# Patient Record
Sex: Female | Born: 1975 | Race: White | Hispanic: No | Smoking: Never smoker
Health system: Southern US, Community
[De-identification: ages and names within clinical notes are randomized; demographics above are authoritative.]

## PROBLEM LIST (undated history)

## (undated) DIAGNOSIS — I2542 Coronary artery dissection: Secondary | ICD-10-CM

## (undated) HISTORY — DX: Coronary artery dissection: I25.42

---

## 2016-11-19 DIAGNOSIS — Z3041 Encounter for surveillance of contraceptive pills: Secondary | ICD-10-CM | POA: Diagnosis not present

## 2016-11-19 DIAGNOSIS — Z01419 Encounter for gynecological examination (general) (routine) without abnormal findings: Secondary | ICD-10-CM | POA: Diagnosis not present

## 2016-11-19 DIAGNOSIS — Z113 Encounter for screening for infections with a predominantly sexual mode of transmission: Secondary | ICD-10-CM | POA: Diagnosis not present

## 2016-11-19 DIAGNOSIS — Z124 Encounter for screening for malignant neoplasm of cervix: Secondary | ICD-10-CM | POA: Diagnosis not present

## 2016-11-19 DIAGNOSIS — Z1389 Encounter for screening for other disorder: Secondary | ICD-10-CM | POA: Diagnosis not present

## 2017-10-06 DIAGNOSIS — L638 Other alopecia areata: Secondary | ICD-10-CM | POA: Diagnosis not present

## 2017-11-25 DIAGNOSIS — Z309 Encounter for contraceptive management, unspecified: Secondary | ICD-10-CM | POA: Diagnosis not present

## 2017-11-25 DIAGNOSIS — Z124 Encounter for screening for malignant neoplasm of cervix: Secondary | ICD-10-CM | POA: Diagnosis not present

## 2017-11-25 DIAGNOSIS — Z01419 Encounter for gynecological examination (general) (routine) without abnormal findings: Secondary | ICD-10-CM | POA: Diagnosis not present

## 2017-11-25 DIAGNOSIS — Z1151 Encounter for screening for human papillomavirus (HPV): Secondary | ICD-10-CM | POA: Diagnosis not present

## 2017-11-25 DIAGNOSIS — Z1389 Encounter for screening for other disorder: Secondary | ICD-10-CM | POA: Diagnosis not present

## 2017-11-26 DIAGNOSIS — Z124 Encounter for screening for malignant neoplasm of cervix: Secondary | ICD-10-CM | POA: Diagnosis not present

## 2018-01-06 DIAGNOSIS — L638 Other alopecia areata: Secondary | ICD-10-CM | POA: Diagnosis not present

## 2018-01-06 DIAGNOSIS — D2371 Other benign neoplasm of skin of right lower limb, including hip: Secondary | ICD-10-CM | POA: Diagnosis not present

## 2018-05-08 DIAGNOSIS — H1089 Other conjunctivitis: Secondary | ICD-10-CM | POA: Diagnosis not present

## 2018-12-27 DIAGNOSIS — L638 Other alopecia areata: Secondary | ICD-10-CM | POA: Diagnosis not present

## 2019-01-03 DIAGNOSIS — Z3202 Encounter for pregnancy test, result negative: Secondary | ICD-10-CM | POA: Diagnosis not present

## 2019-01-03 DIAGNOSIS — Z130 Encounter for screening for diseases of the blood and blood-forming organs and certain disorders involving the immune mechanism: Secondary | ICD-10-CM | POA: Diagnosis not present

## 2019-01-03 DIAGNOSIS — Z01419 Encounter for gynecological examination (general) (routine) without abnormal findings: Secondary | ICD-10-CM | POA: Diagnosis not present

## 2019-01-03 DIAGNOSIS — N926 Irregular menstruation, unspecified: Secondary | ICD-10-CM | POA: Diagnosis not present

## 2019-01-03 DIAGNOSIS — Z1389 Encounter for screening for other disorder: Secondary | ICD-10-CM | POA: Diagnosis not present

## 2019-01-03 DIAGNOSIS — N803 Endometriosis of pelvic peritoneum: Secondary | ICD-10-CM | POA: Diagnosis not present

## 2019-01-03 DIAGNOSIS — Z124 Encounter for screening for malignant neoplasm of cervix: Secondary | ICD-10-CM | POA: Diagnosis not present

## 2019-01-27 DIAGNOSIS — L638 Other alopecia areata: Secondary | ICD-10-CM | POA: Diagnosis not present

## 2020-02-20 DIAGNOSIS — Z1389 Encounter for screening for other disorder: Secondary | ICD-10-CM | POA: Diagnosis not present

## 2020-02-20 DIAGNOSIS — N951 Menopausal and female climacteric states: Secondary | ICD-10-CM | POA: Diagnosis not present

## 2020-02-20 DIAGNOSIS — Z01419 Encounter for gynecological examination (general) (routine) without abnormal findings: Secondary | ICD-10-CM | POA: Diagnosis not present

## 2020-02-20 DIAGNOSIS — E669 Obesity, unspecified: Secondary | ICD-10-CM | POA: Diagnosis not present

## 2020-02-20 DIAGNOSIS — Z124 Encounter for screening for malignant neoplasm of cervix: Secondary | ICD-10-CM | POA: Diagnosis not present

## 2020-02-20 DIAGNOSIS — Z130 Encounter for screening for diseases of the blood and blood-forming organs and certain disorders involving the immune mechanism: Secondary | ICD-10-CM | POA: Diagnosis not present

## 2020-02-20 DIAGNOSIS — N803 Endometriosis of pelvic peritoneum: Secondary | ICD-10-CM | POA: Diagnosis not present

## 2020-04-05 DIAGNOSIS — M79671 Pain in right foot: Secondary | ICD-10-CM | POA: Diagnosis not present

## 2020-04-05 DIAGNOSIS — M2012 Hallux valgus (acquired), left foot: Secondary | ICD-10-CM | POA: Diagnosis not present

## 2020-04-05 DIAGNOSIS — M79672 Pain in left foot: Secondary | ICD-10-CM | POA: Diagnosis not present

## 2020-04-05 DIAGNOSIS — M2011 Hallux valgus (acquired), right foot: Secondary | ICD-10-CM | POA: Diagnosis not present

## 2020-04-10 DIAGNOSIS — M2012 Hallux valgus (acquired), left foot: Secondary | ICD-10-CM | POA: Diagnosis not present

## 2020-04-11 ENCOUNTER — Other Ambulatory Visit: Payer: Self-pay | Admitting: Podiatry

## 2020-04-17 NOTE — Anesthesia Preprocedure Evaluation (Addendum)
Anesthesia Evaluation  Patient identified by MRN, date of birth, ID band Patient awake    Reviewed: Allergy & Precautions, NPO status , Patient's Chart, lab work & pertinent test results, reviewed documented beta blocker date and time   History of Anesthesia Complications Negative for: history of anesthetic complications  Airway Mallampati: II  TM Distance: >3 FB Neck ROM: Full    Dental   Pulmonary    breath sounds clear to auscultation       Cardiovascular (-) angina(-) DOE  Rhythm:Regular Rate:Normal     Neuro/Psych    GI/Hepatic neg GERD  ,  Endo/Other   Endometriosis  Renal/GU      Musculoskeletal   Abdominal   Peds  Hematology   Anesthesia Other Findings   Reproductive/Obstetrics                            Anesthesia Physical Anesthesia Plan  ASA: I  Anesthesia Plan: General   Post-op Pain Management:    Induction: Intravenous  PONV Risk Score and Plan: 3 and Treatment may vary due to age or medical condition, Ondansetron, Dexamethasone and Midazolam  Airway Management Planned: LMA  Additional Equipment:   Intra-op Plan:   Post-operative Plan: Extubation in OR  Informed Consent: I have reviewed the patients History and Physical, chart, labs and discussed the procedure including the risks, benefits and alternatives for the proposed anesthesia with the patient or authorized representative who has indicated his/her understanding and acceptance.       Plan Discussed with: CRNA and Anesthesiologist  Anesthesia Plan Comments:         Anesthesia Quick Evaluation

## 2020-04-18 ENCOUNTER — Other Ambulatory Visit: Payer: Self-pay

## 2020-04-18 ENCOUNTER — Encounter: Payer: Self-pay | Admitting: Podiatry

## 2020-04-23 ENCOUNTER — Other Ambulatory Visit: Payer: Self-pay

## 2020-04-23 ENCOUNTER — Other Ambulatory Visit
Admission: RE | Admit: 2020-04-23 | Discharge: 2020-04-23 | Disposition: A | Discharge status: Home / Self Care | Payer: 59 | Source: Ambulatory Visit | Attending: Podiatry | Admitting: Podiatry

## 2020-04-23 DIAGNOSIS — Z20822 Contact with and (suspected) exposure to covid-19: Secondary | ICD-10-CM | POA: Diagnosis not present

## 2020-04-23 DIAGNOSIS — Z01812 Encounter for preprocedural laboratory examination: Secondary | ICD-10-CM | POA: Insufficient documentation

## 2020-04-23 LAB — SARS CORONAVIRUS 2 (TAT 6-24 HRS): SARS Coronavirus 2: NEGATIVE

## 2020-04-25 ENCOUNTER — Other Ambulatory Visit: Payer: Self-pay

## 2020-04-25 ENCOUNTER — Encounter: Payer: Self-pay | Admitting: Podiatry

## 2020-04-25 ENCOUNTER — Encounter
Admission: RE | Disposition: A | Discharge status: Home / Self Care | Payer: Self-pay | Source: Home / Self Care | Attending: Podiatry

## 2020-04-25 ENCOUNTER — Ambulatory Visit: Payer: 59 | Admitting: Anesthesiology

## 2020-04-25 ENCOUNTER — Ambulatory Visit
Admission: RE | Admit: 2020-04-25 | Discharge: 2020-04-25 | Disposition: A | Discharge status: Home / Self Care | Payer: 59 | Attending: Podiatry | Admitting: Podiatry

## 2020-04-25 DIAGNOSIS — M2012 Hallux valgus (acquired), left foot: Secondary | ICD-10-CM | POA: Insufficient documentation

## 2020-04-25 HISTORY — PX: HALLUX VALGUS AKIN: SHX6622

## 2020-04-25 HISTORY — PX: BUNIONECTOMY: SHX129

## 2020-04-25 SURGERY — BUNIONECTOMY
Anesthesia: General | Site: Toe | Laterality: Left

## 2020-04-25 MED ORDER — ONDANSETRON HCL 4 MG/2ML IJ SOLN
INTRAMUSCULAR | Status: DC | PRN
  Administered 2020-04-25: 4 mg via INTRAVENOUS

## 2020-04-25 MED ORDER — LACTATED RINGERS IV SOLN
100.0000 mL/h | INTRAVENOUS | Status: DC
  Administered 2020-04-25: 100 mL/h via INTRAVENOUS

## 2020-04-25 MED ORDER — LIDOCAINE HCL (CARDIAC) PF 100 MG/5ML IV SOSY
PREFILLED_SYRINGE | INTRAVENOUS | Status: DC | PRN
  Administered 2020-04-25: 40 mg via INTRATRACHEAL

## 2020-04-25 MED ORDER — PROPOFOL 10 MG/ML IV BOLUS
INTRAVENOUS | Status: DC | PRN
  Administered 2020-04-25: 140 mg via INTRAVENOUS

## 2020-04-25 MED ORDER — GLYCOPYRROLATE 0.2 MG/ML IJ SOLN
INTRAMUSCULAR | Status: DC | PRN
  Administered 2020-04-25: .1 mg via INTRAVENOUS

## 2020-04-25 MED ORDER — BUPIVACAINE LIPOSOME 1.3 % IJ SUSP
INTRAMUSCULAR | Status: DC | PRN
  Administered 2020-04-25: 20 mL

## 2020-04-25 MED ORDER — HYDROMORPHONE HCL 1 MG/ML IJ SOLN: 0.2500 mg | INTRAMUSCULAR | Status: DC | PRN

## 2020-04-25 MED ORDER — PROMETHAZINE HCL 25 MG/ML IJ SOLN
6.2500 mg | INTRAMUSCULAR | Status: DC | PRN
  Administered 2020-04-25: 6.25 mg via INTRAVENOUS

## 2020-04-25 MED ORDER — ONDANSETRON HCL 4 MG PO TABS: 4.0000 mg | ORAL_TABLET | Freq: Every day | ORAL | 1 refills | Status: AC | PRN

## 2020-04-25 MED ORDER — 0.9 % SODIUM CHLORIDE (POUR BTL) OPTIME
TOPICAL | Status: DC | PRN
  Administered 2020-04-25: 500 mL

## 2020-04-25 MED ORDER — MIDAZOLAM HCL 5 MG/5ML IJ SOLN
INTRAMUSCULAR | Status: DC | PRN
  Administered 2020-04-25: 2 mg via INTRAVENOUS

## 2020-04-25 MED ORDER — BUPIVACAINE HCL (PF) 0.25 % IJ SOLN
INTRAMUSCULAR | Status: DC | PRN
  Administered 2020-04-25: 10 mL

## 2020-04-25 MED ORDER — CEFAZOLIN SODIUM-DEXTROSE 2-4 GM/100ML-% IV SOLN
2.0000 g | INTRAVENOUS | Status: AC
  Administered 2020-04-25: 2 g via INTRAVENOUS

## 2020-04-25 MED ORDER — MEPERIDINE HCL 25 MG/ML IJ SOLN: 6.2500 mg | INTRAMUSCULAR | Status: DC | PRN

## 2020-04-25 MED ORDER — DEXAMETHASONE SODIUM PHOSPHATE 4 MG/ML IJ SOLN
INTRAMUSCULAR | Status: DC | PRN
  Administered 2020-04-25: 4 mg via INTRAVENOUS

## 2020-04-25 MED ORDER — OXYCODONE HCL 5 MG PO TABS: 5.0000 mg | ORAL_TABLET | Freq: Once | ORAL | Status: DC | PRN

## 2020-04-25 MED ORDER — FENTANYL CITRATE (PF) 100 MCG/2ML IJ SOLN
INTRAMUSCULAR | Status: DC | PRN
  Administered 2020-04-25: 25 ug via INTRAVENOUS
  Administered 2020-04-25: 12.5 ug via INTRAVENOUS
  Administered 2020-04-25: 50 ug via INTRAVENOUS
  Administered 2020-04-25: 12.5 ug via INTRAVENOUS

## 2020-04-25 MED ORDER — OXYCODONE-ACETAMINOPHEN 5-325 MG PO TABS: 1.0000 | ORAL_TABLET | Freq: Four times a day (QID) | ORAL | 0 refills | Status: AC | PRN

## 2020-04-25 MED ORDER — ONDANSETRON HCL 4 MG/2ML IJ SOLN: 4.0000 mg | Freq: Four times a day (QID) | INTRAMUSCULAR | Status: DC | PRN

## 2020-04-25 MED ORDER — POVIDONE-IODINE 7.5 % EX SOLN: Freq: Once | CUTANEOUS | Status: DC

## 2020-04-25 MED ORDER — ONDANSETRON HCL 4 MG PO TABS: 4.0000 mg | ORAL_TABLET | Freq: Four times a day (QID) | ORAL | Status: DC | PRN

## 2020-04-25 MED ORDER — OXYCODONE HCL 5 MG/5ML PO SOLN: 5.0000 mg | Freq: Once | ORAL | Status: DC | PRN

## 2020-04-25 SURGICAL SUPPLY — 46 items
BENZOIN TINCTURE PRP APPL 2/3 (GAUZE/BANDAGES/DRESSINGS) ×2 IMPLANT
BLADE MED AGGRESSIVE (BLADE) IMPLANT
BLADE OSC/SAGITTAL MD 5.5X18 (BLADE) IMPLANT
BLADE SAW LAPIPLASTY 40X11 (INSTRUMENTS) ×2 IMPLANT
BLADE SURG 15 STRL LF DISP TIS (BLADE) ×4 IMPLANT
BLADE SURG 15 STRL SS (BLADE) ×4
BNDG COHESIVE 4X5 TAN STRL (GAUZE/BANDAGES/DRESSINGS) ×2 IMPLANT
BNDG ELASTIC 4X5.8 VLCR STR LF (GAUZE/BANDAGES/DRESSINGS) ×2 IMPLANT
BNDG ESMARK 4X12 TAN STRL LF (GAUZE/BANDAGES/DRESSINGS) ×2 IMPLANT
BNDG GAUZE 4.5X4.1 6PLY STRL (MISCELLANEOUS) ×2 IMPLANT
BNDG STRETCH 4X75 STRL LF (GAUZE/BANDAGES/DRESSINGS) ×2 IMPLANT
CANISTER SUCT 1200ML W/VALVE (MISCELLANEOUS) ×2 IMPLANT
CONTROL 360 (Bone Implant) ×2 IMPLANT
COVER LIGHT HANDLE UNIVERSAL (MISCELLANEOUS) ×4 IMPLANT
CUFF TOURN SGL QUICK 18X4 (TOURNIQUET CUFF) IMPLANT
DRAPE FLUOR MINI C-ARM 54X84 (DRAPES) ×2 IMPLANT
DURAPREP 26ML APPLICATOR (WOUND CARE) ×2 IMPLANT
ELECT REM PT RETURN 9FT ADLT (ELECTROSURGICAL) ×2
ELECTRODE REM PT RTRN 9FT ADLT (ELECTROSURGICAL) ×1 IMPLANT
GAUZE SPONGE 4X4 12PLY STRL (GAUZE/BANDAGES/DRESSINGS) ×2 IMPLANT
GAUZE XEROFORM 1X8 LF (GAUZE/BANDAGES/DRESSINGS) ×2 IMPLANT
GLOVE BIO SURGEON STRL SZ7.5 (GLOVE) ×2 IMPLANT
GLOVE INDICATOR 8.0 STRL GRN (GLOVE) ×2 IMPLANT
GOWN STRL REUS W/ TWL LRG LVL3 (GOWN DISPOSABLE) ×2 IMPLANT
GOWN STRL REUS W/TWL LRG LVL3 (GOWN DISPOSABLE) ×2
KIT TURNOVER KIT A (KITS) ×2 IMPLANT
NS IRRIG 500ML POUR BTL (IV SOLUTION) ×2 IMPLANT
PACK EXTREMITY ARMC (MISCELLANEOUS) ×2 IMPLANT
PENCIL SMOKE EVACUATOR (MISCELLANEOUS) ×2 IMPLANT
RASP SM TEAR CROSS CUT (RASP) ×2 IMPLANT
STOCKINETTE IMPERVIOUS LG (DRAPES) ×2 IMPLANT
STRIP CLOSURE SKIN 1/4X4 (GAUZE/BANDAGES/DRESSINGS) ×2 IMPLANT
SUT ETHILON 4-0 (SUTURE) ×1
SUT ETHILON 4-0 FS2 18XMFL BLK (SUTURE) ×1
SUT MNCRL 4-0 (SUTURE) ×1
SUT MNCRL 4-0 27XMFL (SUTURE) ×1
SUT MNCRL+ 5-0 UNDYED PC-3 (SUTURE) IMPLANT
SUT MONOCRYL 5-0 (SUTURE)
SUT VIC AB 2-0 SH 27 (SUTURE)
SUT VIC AB 2-0 SH 27XBRD (SUTURE) IMPLANT
SUT VIC AB 3-0 SH 27 (SUTURE) ×1
SUT VIC AB 3-0 SH 27X BRD (SUTURE) ×1 IMPLANT
SUT VIC AB 4-0 SH 27 (SUTURE) ×1
SUT VIC AB 4-0 SH 27XANBCTRL (SUTURE) ×1 IMPLANT
SUTURE ETHLN 4-0 FS2 18XMF BLK (SUTURE) ×1 IMPLANT
SUTURE MNCRL 4-0 27XMF (SUTURE) ×1 IMPLANT

## 2020-04-25 NOTE — Transfer of Care (Signed)
Immediate Anesthesia Transfer of Care Note  Patient: Traci May  Procedure(s) Performed: LAPIDUS TYPE LEFT (Left Toe) HALLUX VALGUS AKIN (Left Toe)  Patient Location: PACU  Anesthesia Type: General  Level of Consciousness: awake, alert  and patient cooperative  Airway and Oxygen Therapy: Patient Spontanous Breathing and Patient connected to supplemental oxygen  Post-op Assessment: Post-op Vital signs reviewed, Patient's Cardiovascular Status Stable, Respiratory Function Stable, Patent Airway and No signs of Nausea or vomiting  Post-op Vital Signs: Reviewed and stable  Complications: No apparent anesthesia complications

## 2020-04-25 NOTE — Anesthesia Postprocedure Evaluation (Signed)
Anesthesia Post Note  Patient: Traci May  Procedure(s) Performed: LAPIDUS TYPE LEFT (Left Toe) HALLUX VALGUS AKIN (Left Toe)     Patient location during evaluation: PACU Anesthesia Type: General Level of consciousness: awake and alert Pain management: pain level controlled Vital Signs Assessment: post-procedure vital signs reviewed and stable Respiratory status: spontaneous breathing, nonlabored ventilation, respiratory function stable and patient connected to nasal cannula oxygen Cardiovascular status: blood pressure returned to baseline and stable Postop Assessment: no apparent nausea or vomiting Anesthetic complications: no    Laquinton Bihm A  Antoniette Peake

## 2020-04-25 NOTE — H&P (Signed)
HISTORY AND PHYSICAL INTERVAL NOTE:  04/25/2020  12:06 PM  Traci May  has presented today for surgery, with the diagnosis of M20.12  HALLUX VALGUS OF LEFT FOOT.  The various methods of treatment have been discussed with the patient.  No guarantees were given.  After consideration of risks, benefits and other options for treatment, the patient has consented to surgery.  I have reviewed the patients' chart and labs.     A history and physical examination was performed in my office.  The patient was reexamined.  There have been no changes to this history and physical examination.  Traci May A

## 2020-04-25 NOTE — Discharge Instructions (Signed)
[facility]  POST OPERATIVE INSTRUCTIONS FOR DR. TROXLER, DR. FOWLER, AND DR. [facility] PODIATRY [facility]PART[facility]   1. Take your medication as prescribed.  Pain medication should be taken only as needed.  2. Keep the dressing clean, dry and intact.  3. Keep your foot elevated above the heart level for the first 48 hours.  4. Walking to the bathroom and brief periods of walking are acceptable, unless we have instructed you to be non-weight bearing.  5. Always wear your post-op shoe when walking.  Always use your crutches if you are to be non-weight bearing.  6. Do not take a shower. Baths are permissible as long as the foot is kept out of the water.   7. Every hour you are awake:  - Bend your knee 15 times. - Flex foot 15 times - Massage calf 15 times  8. Call [facility] ([phone]) if any of the following problems occur: - You develop a temperature or fever. - The bandage becomes saturated with blood. - Medication does not stop your pain. - Injury of the foot occurs. - Any symptoms of infection including redness, odor, or red streaks running from wound. -  General Anesthesia, Adult, Care After This sheet gives you information about how to care for yourself after your procedure. Your health care provider may also give you more specific instructions. If you have problems or questions, contact your health care provider. What can I expect after the procedure? After the procedure, the following side effects are common:  Pain or discomfort at the IV site.  Nausea.  Vomiting.  Sore throat.  Trouble concentrating.  Feeling cold or chills.  Weak or tired.  Sleepiness and fatigue.  Soreness and body aches. These side effects can affect parts of the body that were not involved in surgery. Follow these instructions at home:  For at least 24 hours after the procedure:  Have a responsible adult stay with you. It is  important to have someone help care for you until you are awake and alert.  Rest as needed.  Do not: ? Participate in activities in which you could fall or become injured. ? Drive. ? Use heavy machinery. ? Drink alcohol. ? Take sleeping pills or medicines that cause drowsiness. ? Make important decisions or sign legal documents. ? Take care of children on your own. Eating and drinking  Follow any instructions from your health care provider about eating or drinking restrictions.  When you feel hungry, start by eating small amounts of foods that are soft and easy to digest (bland), such as toast. Gradually return to your regular diet.  Drink enough fluid to keep your urine pale yellow.  If you vomit, rehydrate by drinking water, juice, or clear broth. General instructions  If you have sleep apnea, surgery and certain medicines can increase your risk for breathing problems. Follow instructions from your health care provider about wearing your sleep device: ? Anytime you are sleeping, including during daytime naps. ? While taking prescription pain medicines, sleeping medicines, or medicines that make you drowsy.  Return to your normal activities as told by your health care provider. Ask your health care provider what activities are safe for you.  Take over-the-counter and prescription medicines only as told by your health care provider.  If you smoke, do not smoke without supervision.  Keep all follow-up visits as told by your health care provider. This is important. Contact a health care provider if:    You have nausea or vomiting that does not get better with medicine.  You cannot eat or drink without vomiting.  You have pain that does not get better with medicine.  You are unable to pass urine.  You develop a skin rash.  You have a fever.  You have redness around your IV site that gets worse. Get help right away if:  You have difficulty breathing.  You have chest  pain.  You have blood in your urine or stool, or you vomit blood. Summary  After the procedure, it is common to have a sore throat or nausea. It is also common to feel tired.  Have a responsible adult stay with you for the first 24 hours after general anesthesia. It is important to have someone help care for you until you are awake and alert.  When you feel hungry, start by eating small amounts of foods that are soft and easy to digest (bland), such as toast. Gradually return to your regular diet.  Drink enough fluid to keep your urine pale yellow.  Return to your normal activities as told by your health care provider. Ask your health care provider what activities are safe for you. This information is not intended to replace advice given to you by your health care provider. Make sure you discuss any questions you have with your health care provider. Document Revised: 12/18/2017 Document Reviewed: 07/31/2017 Elsevier Patient Education  2020 [employer]  Information for Discharge Teaching: EXPAREL (bupivacaine liposome injectable suspension)   Your surgeon or anesthesiologist gave you EXPAREL(bupivacaine) to help control your pain after surgery.   EXPAREL is a local anesthetic that provides pain relief by numbing the tissue around the surgical site.  EXPAREL is designed to release pain medication over time and can control pain for up to 72 hours.  Depending on how you respond to EXPAREL, you may require less pain medication during your recovery.  Possible side effects:  Temporary loss of sensation or ability to move in the area where bupivacaine was injected.  Nausea, vomiting, constipation  Rarely, numbness and tingling in your mouth or lips, lightheadedness, or anxiety may occur.  Call your doctor right away if you think you may be experiencing any of these sensations, or if you have other questions regarding possible side effects.  Follow all other discharge instructions  given to you by your surgeon or nurse. Eat a healthy diet and drink plenty of water or other fluids.  If you return to the hospital for any reason within 96 hours following the administration of EXPAREL, it is important for health care providers to know that you have received this anesthetic. A teal colored band has been placed on your arm with the date, time and amount of EXPAREL you have received in order to alert and inform your health care providers. Please leave this armband in place for the full 96 hours following administration, and then you may remove the band.  

## 2020-04-25 NOTE — Op Note (Signed)
Operative note   Surgeon:Alejandro Gamel Lawyer: None    Preop diagnosis: Left foot hallux valgus    Postop diagnosis: Same    Procedure: Lapidus hallux valgus correction left foot with lapiplasty set.    EBL: Minimal    Anesthesia:local and general.  Local consisted of a total of 10 cc of 0.25% bupivacaine and 20 cc of Exparel long-acting anesthetic    Hemostasis: Mid calf tourniquet inflated to 200 mmHg for 120 minutes    Specimen: None    Complications: None    Operative indications:Traci May is an 44 y.o. that presents today for surgical intervention.  The risks/benefits/alternatives/complications have been discussed and consent has been given.    Procedure:  Patient was brought into the OR and placed on the operating table in thesupine position. After anesthesia was obtained theleft lower extremity was prepped and draped in usual sterile fashion.  Attention was directed to the dorsal left first metatarsocuneiform joint where a dorsal incision was made just medial to the long extensor tendon.  Sharp and blunt dissection carried down to the periosteum.  Subperiosteal dissection was then undertaken.  This time the joint was freed.  This was then released and derotated into a better stabilized position.  I was then able to apply the positioning device to hold the intermetatarsal angle into a corrected position.  This was then stabilized and the joint seeker was placed into the joint.  The cuts were made to the base of the first metatarsal and medial cuneiform.  The joint was then evaluated.  The joint was then prepared with a 2.0 mm drill bit through the subchondral bone plate.  Next the compression device was applied.  The toe was derotated into a good position and good realignment of the intermetatarsal joint was noted.  Next the dorsal and medial plates were positioned after placement of compression K wires.  Good stability was noted.  Good realignment of the MTPJ was then  noted at this time.  A dorsomedial incision was then performed.  The dorsomedial eminence was noted at this time and transected with a power saw and smoothed with a power rasp.  The wound was flushed with copious amounts of irrigation.  Good realignment of the MTPJ was noted.  A small capsulorrhaphy was performed medially.  Closure was then performed with a 3-0 Vicryl for the deeper and subcutaneous tissues with a 4-0 Vicryl in subcutaneous tissue and a 4-0 Monocryl for skin.  Bulky sterile dressing was then applied.    Patient tolerated the procedure and anesthesia well.  Was transported from the OR to the PACU with all vital signs stable and vascular status intact. To be discharged per routine protocol.  Will follow up in approximately 1 week in the outpatient clinic.

## 2020-04-25 NOTE — Anesthesia Procedure Notes (Signed)
Procedure Name: LMA Insertion Date/Time: 04/25/2020 12:20 PM Performed by: Mayme Genta, CRNA Pre-anesthesia Checklist: Patient identified, Emergency Drugs available, Suction available, Timeout performed and Patient being monitored Patient Re-evaluated:Patient Re-evaluated prior to induction Oxygen Delivery Method: Circle system utilized Preoxygenation: Pre-oxygenation with 100% oxygen Induction Type: IV induction LMA: LMA inserted LMA Size: 3.0 Number of attempts: 1 Placement Confirmation: positive ETCO2 and breath sounds checked- equal and bilateral Tube secured with: Tape

## 2020-04-26 ENCOUNTER — Encounter: Payer: Self-pay | Admitting: *Deleted

## 2020-05-03 DIAGNOSIS — M2012 Hallux valgus (acquired), left foot: Secondary | ICD-10-CM | POA: Diagnosis not present

## 2020-06-07 DIAGNOSIS — M2012 Hallux valgus (acquired), left foot: Secondary | ICD-10-CM | POA: Diagnosis not present

## 2020-06-07 DIAGNOSIS — M79672 Pain in left foot: Secondary | ICD-10-CM | POA: Diagnosis not present

## 2020-07-05 DIAGNOSIS — M2012 Hallux valgus (acquired), left foot: Secondary | ICD-10-CM | POA: Diagnosis not present

## 2020-08-16 DIAGNOSIS — M2012 Hallux valgus (acquired), left foot: Secondary | ICD-10-CM | POA: Diagnosis not present

## 2020-08-16 DIAGNOSIS — M722 Plantar fascial fibromatosis: Secondary | ICD-10-CM | POA: Diagnosis not present

## 2020-08-16 DIAGNOSIS — M79672 Pain in left foot: Secondary | ICD-10-CM | POA: Diagnosis not present

## 2021-01-24 ENCOUNTER — Other Ambulatory Visit: Payer: Self-pay | Admitting: Obstetrics and Gynecology

## 2021-01-24 DIAGNOSIS — Z1231 Encounter for screening mammogram for malignant neoplasm of breast: Secondary | ICD-10-CM

## 2021-02-04 ENCOUNTER — Other Ambulatory Visit: Payer: Self-pay

## 2021-02-04 ENCOUNTER — Ambulatory Visit
Admission: RE | Admit: 2021-02-04 | Discharge: 2021-02-04 | Disposition: A | Discharge status: Home / Self Care | Payer: 59 | Source: Ambulatory Visit | Attending: Obstetrics and Gynecology | Admitting: Obstetrics and Gynecology

## 2021-02-04 DIAGNOSIS — Z1231 Encounter for screening mammogram for malignant neoplasm of breast: Secondary | ICD-10-CM | POA: Insufficient documentation

## 2021-02-18 DIAGNOSIS — N803 Endometriosis of pelvic peritoneum: Secondary | ICD-10-CM | POA: Diagnosis not present

## 2021-02-18 DIAGNOSIS — Z6829 Body mass index (BMI) 29.0-29.9, adult: Secondary | ICD-10-CM | POA: Diagnosis not present

## 2021-02-18 DIAGNOSIS — Z130 Encounter for screening for diseases of the blood and blood-forming organs and certain disorders involving the immune mechanism: Secondary | ICD-10-CM | POA: Diagnosis not present

## 2021-02-18 DIAGNOSIS — Z1389 Encounter for screening for other disorder: Secondary | ICD-10-CM | POA: Diagnosis not present

## 2021-02-18 DIAGNOSIS — Z124 Encounter for screening for malignant neoplasm of cervix: Secondary | ICD-10-CM | POA: Diagnosis not present

## 2021-02-18 DIAGNOSIS — E669 Obesity, unspecified: Secondary | ICD-10-CM | POA: Diagnosis not present

## 2021-02-18 DIAGNOSIS — Z01419 Encounter for gynecological examination (general) (routine) without abnormal findings: Secondary | ICD-10-CM | POA: Diagnosis not present

## 2021-02-19 DIAGNOSIS — R87610 Atypical squamous cells of undetermined significance on cytologic smear of cervix (ASC-US): Secondary | ICD-10-CM | POA: Diagnosis not present

## 2021-02-19 DIAGNOSIS — R87619 Unspecified abnormal cytological findings in specimens from cervix uteri: Secondary | ICD-10-CM | POA: Diagnosis not present

## 2021-02-19 DIAGNOSIS — Z124 Encounter for screening for malignant neoplasm of cervix: Secondary | ICD-10-CM | POA: Diagnosis not present

## 2021-05-30 ENCOUNTER — Other Ambulatory Visit: Payer: Self-pay

## 2021-05-30 MED ORDER — CYCLOBENZAPRINE HCL 10 MG PO TABS
10.0000 mg | ORAL_TABLET | Freq: Two times a day (BID) | ORAL | 0 refills | Status: DC | PRN
  Filled 2021-05-30: qty 45, 23d supply, fill #0

## 2022-02-12 ENCOUNTER — Other Ambulatory Visit: Payer: Self-pay

## 2022-02-12 ENCOUNTER — Telehealth: Payer: 59 | Admitting: Family

## 2022-02-12 DIAGNOSIS — J209 Acute bronchitis, unspecified: Secondary | ICD-10-CM | POA: Diagnosis not present

## 2022-02-12 MED ORDER — PREDNISONE 10 MG PO TABS
ORAL_TABLET | ORAL | 0 refills | Status: DC
  Filled 2022-02-12: qty 21, 6d supply, fill #0

## 2022-02-12 MED ORDER — BENZONATATE 100 MG PO CAPS
100.0000 mg | ORAL_CAPSULE | Freq: Three times a day (TID) | ORAL | 0 refills | Status: DC | PRN
  Filled 2022-02-12: qty 20, 7d supply, fill #0

## 2022-02-12 NOTE — Progress Notes (Signed)

## 2022-02-17 ENCOUNTER — Other Ambulatory Visit: Payer: Self-pay

## 2022-02-17 ENCOUNTER — Ambulatory Visit
Admission: RE | Admit: 2022-02-17 | Discharge: 2022-02-17 | Disposition: A | Discharge status: Home / Self Care | Payer: 59 | Source: Ambulatory Visit | Attending: Emergency Medicine | Admitting: Emergency Medicine

## 2022-02-17 VITALS — BP 143/85 | HR 64 | Temp 98.2°F | Resp 18

## 2022-02-17 DIAGNOSIS — J0100 Acute maxillary sinusitis, unspecified: Secondary | ICD-10-CM

## 2022-02-17 MED ORDER — AMOXICILLIN 875 MG PO TABS
875.0000 mg | ORAL_TABLET | Freq: Two times a day (BID) | ORAL | 0 refills | Status: AC
  Filled 2022-02-17: qty 20, 10d supply, fill #0

## 2022-02-17 MED ORDER — BENZONATATE 100 MG PO CAPS
100.0000 mg | ORAL_CAPSULE | Freq: Three times a day (TID) | ORAL | 0 refills | Status: DC | PRN
  Filled 2022-02-17: qty 21, 7d supply, fill #0

## 2022-02-17 NOTE — Discharge Instructions (Addendum)
Take the amoxicillin and Tessalon Perles as directed.    Follow up with your primary care provider if your symptoms are not improving.    

## 2022-02-17 NOTE — ED Triage Notes (Signed)
Pt presents with cough, HA, chest congestion and sinus pressure x 1 week. Pt had an e-visit on 2/15 and prescribed prednisone and tessalon.

## 2022-02-17 NOTE — ED Provider Notes (Signed)
Traci May    CSN: [id number] Arrival date & time: 02/17/22  1334      History   Chief Complaint Chief Complaint  Patient presents with   Cough   Sinus Pressure   Headache    HPI Traci May is a 46 y.o. female.  Patient presents with >1 week history of congestion, sinus pressure, sinus headache, cough.  No fever, chills, rash, shortness of breath, vomiting, diarrhea, or other symptoms.  She had an ED visit on 02/12/2022; diagnosed with acute bronchitis; treated with prednisone and Tessalon Perles.  Her symptoms have not improved.  The history is provided by the patient and medical records.   History reviewed. No pertinent past medical history.  There are no problems to display for this patient.   Past Surgical History:  Procedure Laterality Date   BUNIONECTOMY Left 04/25/2020   Procedure: LAPIDUS TYPE LEFT;  Surgeon: [name], DPM;  Location: [facility];  Service: Podiatry;  Laterality: Left;  general with local   HALLUX VALGUS AKIN Left 04/25/2020   Procedure: HALLUX VALGUS AKIN;  Surgeon: [name], DPM;  Location: [facility];  Service: Podiatry;  Laterality: Left;    OB History   No obstetric history on file.      Home Medications    Prior to Admission medications   Medication Sig Start Date End Date Taking? Authorizing Provider  amoxicillin (AMOXIL) 875 MG tablet Take 1 tablet (875 mg total) by mouth 2 (two) times daily for 10 days. 02/17/22 02/27/22 Yes Sharion Balloon, NP  benzonatate (TESSALON) 100 MG capsule Take 1 capsule (100 mg total) by mouth 3 (three) times daily as needed for cough. 02/17/22  Yes Sharion Balloon, NP  cyclobenzaprine (FLEXERIL) 10 MG tablet Take 1 tablet (10 mg total) by mouth 2 (two) times daily as needed. 05/30/21   Molli Barrows, MD  Multiple Vitamin (MULTIVITAMIN) capsule Take 1 capsule by mouth daily.    [provider]  predniSONE (DELTASONE) 10 MG tablet Take 6 tabs on day 1, 5 tabs on day  2, 4 tabs on day 3, 3 tabs on day 4, 2 tabs on day 5, 1 tab on day 6. Then stop. 02/12/22   Sharion Balloon, FNP    Family History History reviewed. No pertinent family history.  Social History Social History   Tobacco Use   Smoking status: Never   Smokeless tobacco: Never  Vaping Use   Vaping Use: Never used  Substance Use Topics   Alcohol use: Yes    Comment: occasional   Drug use: Never     Allergies   Patient has no known allergies.   Review of Systems Review of Systems  Constitutional:  Negative for chills and fever.  HENT:  Positive for congestion, sinus pressure and sinus pain. Negative for ear pain and sore throat.   Respiratory:  Positive for cough. Negative for shortness of breath.   Cardiovascular:  Negative for chest pain and palpitations.  Gastrointestinal:  Negative for diarrhea and vomiting.  Skin:  Negative for color change and rash.  All other systems reviewed and are negative.   Physical Exam Triage Vital Signs ED Triage Vitals  Enc Vitals Group     BP      Pulse      Resp      Temp      Temp src      SpO2      Weight  Height      Head Circumference      Peak Flow      Pain Score      Pain Loc      Pain Edu?      Excl. in [place]?    No data found.  Updated Vital Signs BP (!) 143/85    Pulse 64    Temp 98.2 F (36.8 C)    Resp 18    LMP 02/09/2019 Comment: postmenopause   SpO2 96%   Visual Acuity Right Eye Distance:   Left Eye Distance:   Bilateral Distance:    Right Eye Near:   Left Eye Near:    Bilateral Near:     Physical Exam Vitals and nursing note reviewed.  Constitutional:      General: She is not in acute distress.    Appearance: Normal appearance. She is well-developed. She is not ill-appearing.  HENT:     Right Ear: Tympanic membrane normal.     Left Ear: Tympanic membrane normal.     Nose: Congestion and rhinorrhea present.     Mouth/Throat:     Mouth: Mucous membranes are moist.     Pharynx: Oropharynx is  clear.  Cardiovascular:     Rate and Rhythm: Normal rate and regular rhythm.     Heart sounds: Normal heart sounds.  Pulmonary:     Effort: Pulmonary effort is normal. No respiratory distress.     Breath sounds: Normal breath sounds.  Musculoskeletal:     Cervical back: Neck supple.  Skin:    General: Skin is warm and dry.  Neurological:     Mental Status: She is alert.  Psychiatric:        Mood and Affect: Mood normal.        Behavior: Behavior normal.     UC Treatments / Results  Labs (all labs ordered are listed, but only abnormal results are displayed) Labs Reviewed - No data to display  EKG   Radiology No results found.  Procedures Procedures (including critical care time)  Medications Ordered in UC Medications - No data to display  Initial Impression / Assessment and Plan / UC Course  I have reviewed the triage vital signs and the nursing notes.  Pertinent labs & imaging results that were available during my care of the patient were reviewed by me and considered in my medical decision making (see chart for details).   Acute sinusitis.  Patient has been symptomatic for more than a week and is not improving despite treatment with prednisone and Tessalon Perles were prescribed on an e-visit.  Treating sinus infection with amoxicillin.  Patient states she is not able to take cough syrup; syrups causes her to vomit.  Refill given on Tessalon Perles.  Instructed patient to follow up with her PCP if her symptoms are not improving.  She agrees to plan of care.     Final Clinical Impressions(s) / UC Diagnoses   Final diagnoses:  Acute non-recurrent maxillary sinusitis     Discharge Instructions      Take the amoxicillin and Tessalon Perles as directed.  Follow up with your primary care provider if your symptoms are not improving.        ED Prescriptions     Medication Sig Dispense Auth. Provider   benzonatate (TESSALON) 100 MG capsule Take 1 capsule (100  mg total) by mouth 3 (three) times daily as needed for cough. 21 capsule Sharion Balloon, NP   amoxicillin (AMOXIL)  875 MG tablet Take 1 tablet (875 mg total) by mouth 2 (two) times daily for 10 days. 20 tablet Sharion Balloon, NP      PDMP not reviewed this encounter.   Sharion Balloon, NP 02/17/22 [phone]

## 2022-03-10 DIAGNOSIS — Z01419 Encounter for gynecological examination (general) (routine) without abnormal findings: Secondary | ICD-10-CM | POA: Diagnosis not present

## 2022-03-10 DIAGNOSIS — Z1389 Encounter for screening for other disorder: Secondary | ICD-10-CM | POA: Diagnosis not present

## 2022-03-10 DIAGNOSIS — Z124 Encounter for screening for malignant neoplasm of cervix: Secondary | ICD-10-CM | POA: Diagnosis not present

## 2022-03-10 DIAGNOSIS — E669 Obesity, unspecified: Secondary | ICD-10-CM | POA: Diagnosis not present

## 2022-03-10 DIAGNOSIS — N803 Endometriosis of pelvic peritoneum, unspecified: Secondary | ICD-10-CM | POA: Diagnosis not present

## 2022-03-10 DIAGNOSIS — Z130 Encounter for screening for diseases of the blood and blood-forming organs and certain disorders involving the immune mechanism: Secondary | ICD-10-CM | POA: Diagnosis not present

## 2022-03-11 ENCOUNTER — Other Ambulatory Visit: Payer: Self-pay | Admitting: Obstetrics and Gynecology

## 2022-03-11 DIAGNOSIS — Z1231 Encounter for screening mammogram for malignant neoplasm of breast: Secondary | ICD-10-CM

## 2022-04-18 ENCOUNTER — Ambulatory Visit
Admission: RE | Admit: 2022-04-18 | Discharge: 2022-04-18 | Disposition: A | Discharge status: Home / Self Care | Payer: 59 | Source: Ambulatory Visit | Attending: Obstetrics and Gynecology | Admitting: Obstetrics and Gynecology

## 2022-04-18 DIAGNOSIS — Z1231 Encounter for screening mammogram for malignant neoplasm of breast: Secondary | ICD-10-CM | POA: Insufficient documentation

## 2022-04-30 ENCOUNTER — Other Ambulatory Visit: Payer: Self-pay

## 2022-04-30 ENCOUNTER — Telehealth: Payer: 59 | Admitting: Physician Assistant

## 2022-04-30 DIAGNOSIS — L237 Allergic contact dermatitis due to plants, except food: Secondary | ICD-10-CM

## 2022-04-30 MED ORDER — PREDNISONE 10 MG PO TABS
ORAL_TABLET | ORAL | 0 refills | Status: AC
  Filled 2022-04-30: qty 37, 14d supply, fill #0

## 2022-04-30 NOTE — Progress Notes (Signed)
I have spent 5 minutes in review of e-visit questionnaire, review and updating patient chart, medical decision making and response to patient.   Ramiyah Mcclenahan Cody Laiklynn Raczynski, PA-C    

## 2022-04-30 NOTE — Progress Notes (Signed)
E-Visit for [employer] ? ?We are sorry that you are not feeing well.  Here is how we plan to help! ? ?Based on what you have shared with me it looks like you have had an allergic reaction to the oily resin from a group of plants.  This resin is very sticky, so it easily attaches to your skin, clothing, tools equipment, and pet's fur.   ? ?This blistering rash is often called poison ivy rash although it can come from contact with the leaves, stems and roots of poison ivy, poison oak and poison sumac. ? ?The oily resin contains urushiol (u-ROO-she-ol) that produces a skin rash on exposed skin.  The severity of the rash depends on the amount of urushiol that gets on your skin.  A section of skin with more urushiol on it may develop a rash sooner. ? ?The rash usually develops 12-48 hours after exposure and can last two to three weeks.  Your skin must come in direct contact with the plant's oil to be affected.  Blister fluid doesn't spread the rash.  However, if you come into contact with a piece of clothing or pet fur that has urushiol on it, the rash may spread out.  You can also transfer the oil to other parts of your body with your fingers. ? ?Often the rash looks like a straight line because of the way the plant brushes against your skin.  Since your rash is widespread or has resulted in a large number of blisters, I have prescribed an oral corticosteroid.  Please follow these recommendations:  I have sent a prednisone taper pack to your chosen pharmacy. Be sure to follow the instructions below carefully and complete the entire prescription. You may use Benadryl or Caladryl topical lotions to sooth the itch and remember cool, not hot, showers and baths can help relieve the itching!  Place cool, wet compresses on the affected area for 15-30 minutes several times a day.  You may also take oral antihistamines, such as diphenhydramine (Benadryl, others), which may also help you sleep better.  Watch your skin for any  purulent (pus) drainage or red streaking from the site.  If this occurs, contact your provider.  You may require an antibiotic for a skin infection.  Make sure that the clothes you were wearing as well as any towels or sheets that may have come in contact with the oil (urushiol) are washed in detergent and hot water.      ? ?I have developed the following plan to treat your condition I am prescribing a two week course of steroids (37 tablets of 10 mg prednisone).  Days 1-4 take 4 tablets (40 mg) daily  Days 5-8 take 3 tablets (30 mg) daily, Days 9-11 take 2 tablets (20 mg) daily, Days 12-14 take 1 tablet (10 mg) daily.   ? ?What can you do to prevent this rash? ? ?Avoid the plants.  Learn how to identify poison ivy, poison oak and poison sumac in all seasons.  When hiking or engaging in other activities that might expose you to these plants, try to stay on cleared pathways.  If camping, make sure you pitch your tent in an area free of these plants.  Keep pets from running through wooded areas so that urushiol doesn't accidentally stick to their fur, which you may touch. ? ?Remove or kill the plants.  In your yard, you can get rid of poison ivy by applying an herbicide or pulling it out  of the ground, including the roots, while wearing heavy gloves.  Afterward remove the gloves and thoroughly wash them and your hands.  Don't burn poison ivy or related plants because the urushiol can be carried by smoke. ? ?Wear protective clothing.  If needed, protect your skin by wearing socks, boots, pants, long sleeves and vinyl gloves. ? ?Wash your skin right away.  Washing off the oil with soap and water within 30 minutes of exposure may reduce your chances of getting a poison ivy rash.  Even washing after an hour or so can help reduce the severity of the rash. ? ?If you walk through some poison ivy and then later touch your shoes, you may get some urushiol on your hands, which may then transfer to your face or body by touching  or rubbing.  If the contaminated object isn't cleaned, the urushiol on it can still cause a skin reaction years later. ? ? ? ?Be careful not to reuse towels after you have washed your skin.  Also carefully wash clothing in detergent and hot water to remove all traces of the oil.  Handle contaminated clothing carefully so you don't transfer the urushiol to yourself, furniture, rugs or appliances. ? ?Remember that pets can carry the oil on their fur and paws.  If you think your pet may be contaminated with urushiol, put on some long rubber gloves and give your pet a bath. ? ?Finally, be careful not to burn these plants as the smoke can contain traces of the oil.  Inhaling the smoke may result in difficulty breathing. If that occurred you should see a physician as soon as possible. ? ?See your doctor right away if: ? ?The reaction is severe or widespread ?You inhaled the smoke from burning poison ivy and are having difficulty breathing ?Your skin continues to swell ?The rash affects your eyes, mouth or genitals ?Blisters are oozing pus ?You develop a fever greater than 100 F (37.8 C) ?The rash doesn't get better within a few weeks. ? ?If you scratch the poison ivy rash, bacteria under your fingernails may cause the skin to become infected.  See your doctor if pus starts oozing from the blisters.  Treatment generally includes antibiotics. ? ?Poison ivy treatments are usually limited to self-care methods.  And the rash typically goes away on its own in two to three weeks.    ? ?If the rash is widespread or results in a large number of blisters, your doctor may prescribe an oral corticosteroid, such as prednisone.  If a bacterial infection has developed at the rash site, your doctor may give you a prescription for an oral antibiotic. ? ?MAKE SURE YOU ? ?Understand these instructions. ?Will watch your condition. ?Will get help right away if you are not doing well or get worse. ? ? ?Thank you for choosing an  e-visit. ? ?Your e-visit answers were reviewed by a board certified advanced clinical practitioner to complete your personal care plan. Depending upon the condition, your plan could have included both over the counter or prescription medications. ? ?Please review your pharmacy choice. Make sure the pharmacy is open so you can pick up prescription now. If there is a problem, you may contact your provider through [employer] and have the prescription routed to another pharmacy.  Your safety is important to [place]. If you have drug allergies check your prescription carefully.  ? ?For the next 24 hours you can use MyChart to ask questions about today's visit, request a  non-urgent call back, or ask for a work or school excuse. ?You will get an email in the next two days asking about your experience. I hope that your e-visit has been valuable and will speed your recovery. ?  ? ?

## 2022-05-20 ENCOUNTER — Other Ambulatory Visit: Payer: Self-pay

## 2022-05-20 ENCOUNTER — Ambulatory Visit
Admission: RE | Admit: 2022-05-20 | Discharge: 2022-05-20 | Disposition: A | Discharge status: Home / Self Care | Payer: 59 | Source: Ambulatory Visit | Attending: Family Medicine | Admitting: Family Medicine

## 2022-05-20 VITALS — BP 122/88 | HR 65 | Temp 98.1°F | Resp 18

## 2022-05-20 DIAGNOSIS — L249 Irritant contact dermatitis, unspecified cause: Secondary | ICD-10-CM | POA: Diagnosis not present

## 2022-05-20 MED ORDER — DEXAMETHASONE SODIUM PHOSPHATE 10 MG/ML IJ SOLN
10.0000 mg | Freq: Once | INTRAMUSCULAR | Status: AC
  Administered 2022-05-20: 10 mg via INTRAMUSCULAR

## 2022-05-20 MED ORDER — TRIAMCINOLONE ACETONIDE 0.1 % EX CREA
1.0000 "application " | TOPICAL_CREAM | Freq: Three times a day (TID) | CUTANEOUS | 0 refills | Status: DC | PRN
  Filled 2022-05-20: qty 454, 30d supply, fill #0

## 2022-05-20 MED ORDER — FAMOTIDINE 40 MG PO TABS
40.0000 mg | ORAL_TABLET | Freq: Two times a day (BID) | ORAL | 0 refills | Status: DC
  Filled 2022-05-20: qty 14, 7d supply, fill #0

## 2022-05-20 NOTE — ED Provider Notes (Signed)
Traci May    CSN: [id number] Arrival date & time: 05/20/22  1344      History   Chief Complaint Chief Complaint  Patient presents with   Rash    I have a rash that will not go away. I got poison oak or ivy about 1 month ago and it was almost gone when this started. I got 2 weeks worth of steroids, which I finished last Wednesday, it was starting to go away again but now it's back again. - Entered by patient    HPI Traci May is a 46 y.o. female.   HPI Patient here with irritant contact dermatitis.  Patient had an E-visit 2 weeks ago and was prescribed a 12-day taper of steroids.  Patient reports that while on steroids symptoms appeared to improve although rash never completely went away.  After discontinuing the steroids rash returned and has not gone away or improved.  She is also been using topical cortisone cream without any improvement of symptoms.  Patient believes she had made contact with poison ivy or poison oak prior to the onset of symptoms over a month ago.   History reviewed. No pertinent past medical history.  There are no problems to display for this patient.   Past Surgical History:  Procedure Laterality Date   BUNIONECTOMY Left 04/25/2020   Procedure: LAPIDUS TYPE LEFT;  Surgeon: [name], DPM;  Location: [place];  Service: Podiatry;  Laterality: Left;  general with local   HALLUX VALGUS AKIN Left 04/25/2020   Procedure: HALLUX VALGUS AKIN;  Surgeon: [name], DPM;  Location: [place];  Service: Podiatry;  Laterality: Left;    OB History   No obstetric history on file.      Home Medications    Prior to Admission medications   Medication Sig Start Date End Date Taking? Authorizing Provider  famotidine (PEPCID) 40 MG tablet Take 1 tablet (40 mg total) by mouth 2 (two) times daily for 7 days. 05/20/22 05/27/22 Yes Scot Jun, FNP  triamcinolone cream (KENALOG) 0.1 % Apply 1 application. topically 3  (three) times daily as needed. 05/20/22  Yes Scot Jun, FNP  benzonatate (TESSALON) 100 MG capsule Take 1 capsule (100 mg total) by mouth 3 (three) times daily as needed for cough. 02/17/22   Sharion Balloon, NP  cyclobenzaprine (FLEXERIL) 10 MG tablet Take 1 tablet (10 mg total) by mouth 2 (two) times daily as needed. 05/30/21   Molli Barrows, MD  Multiple Vitamin (MULTIVITAMIN) capsule Take 1 capsule by mouth daily.    [provider]    Family History Family History  Problem Relation Age of Onset   Breast cancer Neg Hx     Social History Social History   Tobacco Use   Smoking status: Never   Smokeless tobacco: Never  Vaping Use   Vaping Use: Never used  Substance Use Topics   Alcohol use: Yes    Comment: occasional   Drug use: Never     Allergies   Patient has no known allergies.   Review of Systems Review of Systems Pertinent negatives listed in HPI   Physical Exam Triage Vital Signs ED Triage Vitals  Enc Vitals Group     BP 05/20/22 1404 122/88     Pulse Rate 05/20/22 1404 65     Resp 05/20/22 1404 18     Temp 05/20/22 1404 98.1 F (36.7 C)     Temp Source 05/20/22 1404 Oral  SpO2 05/20/22 1404 100 %     Weight --      Height --      Head Circumference --      Peak Flow --      Pain Score 05/20/22 1403 0     Pain Loc --      Pain Edu? --      Excl. in [place]? --    No data found.  Updated Vital Signs BP 122/88 (BP Location: Left Arm)   Pulse 65   Temp 98.1 F (36.7 C) (Oral)   Resp 18   LMP 02/09/2019 Comment: postmenopause  SpO2 100%   Visual Acuity Right Eye Distance:   Left Eye Distance:   Bilateral Distance:    Right Eye Near:   Left Eye Near:    Bilateral Near:     Physical Exam Vitals reviewed.  Constitutional:      Appearance: Normal appearance.  HENT:     Head: Normocephalic and atraumatic.  Cardiovascular:     Rate and Rhythm: Normal rate and regular rhythm.  Pulmonary:     Effort: Pulmonary effort is  normal.     Breath sounds: Normal breath sounds.  Skin:    Findings: Rash present. Rash is macular and papular.     Comments: Bilateral upper extremities. Raised maculopapular rash with fine blister present. No exudate or drainage present   Neurological:     Mental Status: She is alert.     GCS: GCS eye subscore is 4. GCS verbal subscore is 5. GCS motor subscore is 6.  Psychiatric:        Attention and Perception: Attention normal.        Mood and Affect: Mood normal.        Speech: Speech normal.     UC Treatments / Results  Labs (all labs ordered are listed, but only abnormal results are displayed) Labs Reviewed - No data to display  EKG   Radiology No results found.  Procedures Procedures (including critical care time)  Medications Ordered in UC Medications  dexamethasone (DECADRON) injection 10 mg (10 mg Intramuscular Given 05/20/22 1457)    Initial Impression / Assessment and Plan / UC Course  I have reviewed the triage vital signs and the nursing notes.  Pertinent labs & imaging results that were available during my care of the patient were reviewed by me and considered in my medical decision making (see chart for details).    Irritant contact dermatitis  Decadron IM given here in clinic. Continue management of allergic reaction with famotidine 40 mg twice daily for 7 days.  Did not want to continue a long course of prednisone as patient completed entire 12-day taper of prednisone recently.  Will trial triamcinolone cream applications 3 times daily directly to affected area for treatment of contact dermatitis.  Did advise patient to follow-up with dermatology if symptoms have not completely resolved within the next 5 days that she has been battling this for over a month.  Information provided to follow-up with dermatology.  Return as needed. Final Clinical Impressions(s) / UC Diagnoses   Final diagnoses:  Irritant contact dermatitis, unspecified trigger      Discharge Instructions      Apply topical steroid cream to affected areas up to 3 times daily.  To keep skin hydrated recommend use of petroleum jelly mixed with the triamcinolone cream.  Start oral famotidine 40 mg twice daily for total of 7 days to decrease hypersensitivity which is causing the  rash to persist.  If your symptoms have not readily improve within the next 5 days do recommend follow-up with a dermatologist.  I will try to obtain an appointment as soon as possible as if symptoms do completely resolve you can always cancel your appointment     ED Prescriptions     Medication Sig Dispense Auth. Provider   famotidine (PEPCID) 40 MG tablet Take 1 tablet (40 mg total) by mouth 2 (two) times daily for 7 days. 14 tablet Scot Jun, FNP   triamcinolone cream (KENALOG) 0.1 % Apply 1 application. topically 3 (three) times daily as needed. 454 g Scot Jun, FNP      PDMP not reviewed this encounter.   Scot Jun, FNP 05/20/22 1501

## 2022-05-20 NOTE — ED Triage Notes (Signed)
Pt presents with a rash on her legs and arms which she believes is poison oak. She tried OTC treatment that did not help. She had an e-visit and prescribed prednisone and it is not better.

## 2022-05-20 NOTE — Discharge Instructions (Signed)
Apply topical steroid cream to affected areas up to 3 times daily.  To keep skin hydrated recommend use of petroleum jelly mixed with the triamcinolone cream.  Start oral famotidine 40 mg twice daily for total of 7 days to decrease hypersensitivity which is causing the rash to persist.  If your symptoms have not readily improve within the next 5 days do recommend follow-up with a dermatologist.  I will try to obtain an appointment as soon as possible as if symptoms do completely resolve you can always cancel your appointment

## 2022-08-01 ENCOUNTER — Other Ambulatory Visit (HOSPITAL_COMMUNITY): Payer: Self-pay

## 2023-01-20 DIAGNOSIS — M79671 Pain in right foot: Secondary | ICD-10-CM | POA: Diagnosis not present

## 2023-02-02 ENCOUNTER — Other Ambulatory Visit: Payer: Self-pay | Admitting: Podiatry

## 2023-02-11 ENCOUNTER — Encounter: Payer: Self-pay | Admitting: Podiatry

## 2023-02-17 NOTE — Discharge Instructions (Signed)
[facility]  POST OPERATIVE INSTRUCTIONS FOR DR. FOWLER AND [facility] POD[facility]   Take your medication as prescribed.  Pain medication should be taken only as needed.  Keep the dressing clean, dry and intact.  Keep your foot elevated above the heart level for the first 48 hours.  Walking to the bathroom and brief periods of walking are acceptable, unless we have instructed you to be non-weight bearing.  Always wear your post-op shoe when walking.  Always use your crutches if you are to be non-weight bearing.  Do not take a shower. Baths are permissible as long as the foot is kept out of the water.   Every hour you are awake:  Bend your knee 15 times. Flex foot 15 times Massage calf 15 times  Call [facility] ([phone]) if any of the following problems occur: You develop a temperature or fever. The bandage becomes saturated with blood. Medication does not stop your pain. Injury of the foot occurs. Any symptoms of infection including redness, odor, or red streaks running from wound. 

## 2023-02-18 ENCOUNTER — Ambulatory Visit: Payer: Commercial Managed Care - PPO | Admitting: Anesthesiology

## 2023-02-18 ENCOUNTER — Ambulatory Visit
Admission: RE | Admit: 2023-02-18 | Discharge: 2023-02-18 | Disposition: A | Discharge status: Home / Self Care | Payer: Commercial Managed Care - PPO | Attending: Podiatry | Admitting: Podiatry

## 2023-02-18 ENCOUNTER — Other Ambulatory Visit: Payer: Self-pay

## 2023-02-18 ENCOUNTER — Encounter
Admission: RE | Disposition: A | Discharge status: Home / Self Care | Payer: Self-pay | Source: Home / Self Care | Attending: Podiatry

## 2023-02-18 ENCOUNTER — Encounter: Payer: Self-pay | Admitting: Podiatry

## 2023-02-18 DIAGNOSIS — M79671 Pain in right foot: Secondary | ICD-10-CM | POA: Diagnosis not present

## 2023-02-18 DIAGNOSIS — M2011 Hallux valgus (acquired), right foot: Secondary | ICD-10-CM | POA: Insufficient documentation

## 2023-02-18 HISTORY — PX: BUNIONECTOMY: SHX129

## 2023-02-18 SURGERY — BUNIONECTOMY
Anesthesia: General | Site: Toe | Laterality: Right

## 2023-02-18 MED ORDER — BUPIVACAINE HCL (PF) 0.25 % IJ SOLN
INTRAMUSCULAR | Status: DC | PRN
  Administered 2023-02-18: 10 mL

## 2023-02-18 MED ORDER — OXYCODONE HCL 5 MG PO TABS: 5.0000 mg | ORAL_TABLET | Freq: Once | ORAL | Status: DC | PRN

## 2023-02-18 MED ORDER — FENTANYL CITRATE (PF) 100 MCG/2ML IJ SOLN
INTRAMUSCULAR | Status: DC | PRN
  Administered 2023-02-18 (×4): 25 ug via INTRAVENOUS

## 2023-02-18 MED ORDER — BUPIVACAINE LIPOSOME 1.3 % IJ SUSP
INTRAMUSCULAR | Status: DC | PRN
  Administered 2023-02-18: 10 mL

## 2023-02-18 MED ORDER — ONDANSETRON HCL 4 MG PO TABS: 4.0000 mg | ORAL_TABLET | Freq: Four times a day (QID) | ORAL | Status: DC | PRN

## 2023-02-18 MED ORDER — METOCLOPRAMIDE HCL 5 MG/ML IJ SOLN: 5.0000 mg | Freq: Three times a day (TID) | INTRAMUSCULAR | Status: DC | PRN

## 2023-02-18 MED ORDER — CEFAZOLIN SODIUM-DEXTROSE 2-4 GM/100ML-% IV SOLN
2.0000 g | INTRAVENOUS | Status: AC
  Administered 2023-02-18: 2 g via INTRAVENOUS

## 2023-02-18 MED ORDER — OXYCODONE HCL 5 MG/5ML PO SOLN: 5.0000 mg | Freq: Once | ORAL | Status: DC | PRN

## 2023-02-18 MED ORDER — METOCLOPRAMIDE HCL 5 MG PO TABS: 5.0000 mg | ORAL_TABLET | Freq: Three times a day (TID) | ORAL | Status: DC | PRN

## 2023-02-18 MED ORDER — ONDANSETRON HCL 4 MG/2ML IJ SOLN: 4.0000 mg | Freq: Four times a day (QID) | INTRAMUSCULAR | Status: DC | PRN

## 2023-02-18 MED ORDER — SODIUM CHLORIDE 0.9 % IR SOLN
Status: DC | PRN
  Administered 2023-02-18: 500 mL

## 2023-02-18 MED ORDER — FENTANYL CITRATE PF 50 MCG/ML IJ SOSY: 25.0000 ug | PREFILLED_SYRINGE | INTRAMUSCULAR | Status: DC | PRN

## 2023-02-18 MED ORDER — PROPOFOL 10 MG/ML IV BOLUS
INTRAVENOUS | Status: DC | PRN
  Administered 2023-02-18: 130 mg via INTRAVENOUS

## 2023-02-18 MED ORDER — MIDAZOLAM HCL 5 MG/5ML IJ SOLN
INTRAMUSCULAR | Status: DC | PRN
  Administered 2023-02-18: 2 mg via INTRAVENOUS

## 2023-02-18 MED ORDER — DEXAMETHASONE SODIUM PHOSPHATE 4 MG/ML IJ SOLN
INTRAMUSCULAR | Status: DC | PRN
  Administered 2023-02-18: 4 mg via INTRAVENOUS

## 2023-02-18 MED ORDER — LACTATED RINGERS IV SOLN: INTRAVENOUS | Status: DC

## 2023-02-18 MED ORDER — ONDANSETRON HCL 4 MG/2ML IJ SOLN
INTRAMUSCULAR | Status: DC | PRN
  Administered 2023-02-18: 4 mg via INTRAVENOUS

## 2023-02-18 SURGICAL SUPPLY — 49 items
APL SKNCLS STERI-STRIP NONHPOA (GAUZE/BANDAGES/DRESSINGS) ×1
BENZOIN TINCTURE PRP APPL 2/3 (GAUZE/BANDAGES/DRESSINGS) ×1 IMPLANT
BLADE SAW LAPIPLASTY 40X11 (BLADE) IMPLANT
BLADE SURG 15 STRL LF DISP TIS (BLADE) IMPLANT
BLADE SURG 15 STRL SS (BLADE) ×1
BNDG CMPR 5X4 CHSV STRCH STRL (GAUZE/BANDAGES/DRESSINGS) ×1
BNDG CMPR 75X41 PLY HI ABS (GAUZE/BANDAGES/DRESSINGS) ×1
BNDG CMPR STD VLCR NS LF 5.8X4 (GAUZE/BANDAGES/DRESSINGS) ×1
BNDG COHESIVE 4X5 TAN STRL LF (GAUZE/BANDAGES/DRESSINGS) IMPLANT
BNDG ELASTIC 4X5.8 VLCR NS LF (GAUZE/BANDAGES/DRESSINGS) ×1 IMPLANT
BNDG ELASTIC 4X5.8 VLCR STR LF (GAUZE/BANDAGES/DRESSINGS) ×1 IMPLANT
BNDG ESMARCH 4 X 12 STRL LF (GAUZE/BANDAGES/DRESSINGS) ×1
BNDG ESMARCH 4X12 STRL LF (GAUZE/BANDAGES/DRESSINGS) ×1 IMPLANT
BNDG GAUZE DERMACEA FLUFF 4 (GAUZE/BANDAGES/DRESSINGS) ×1 IMPLANT
BNDG GZE DERMACEA 4 6PLY (GAUZE/BANDAGES/DRESSINGS) ×1
BNDG STRETCH 4X75 STRL LF (GAUZE/BANDAGES/DRESSINGS) ×1 IMPLANT
BOOT STEPPER DURA SM (SOFTGOODS) IMPLANT
CANISTER SUCT 1200ML W/VALVE (MISCELLANEOUS) ×1 IMPLANT
COVER LIGHT HANDLE UNIVERSAL (MISCELLANEOUS) ×2 IMPLANT
CUFF TOURN SGL QUICK 18X4 (TOURNIQUET CUFF) IMPLANT
DRAPE FLUOR MINI C-ARM 54X84 (DRAPES) ×1 IMPLANT
DURAPREP 26ML APPLICATOR (WOUND CARE) ×1 IMPLANT
ELECT REM PT RETURN 9FT ADLT (ELECTROSURGICAL) ×1
ELECTRODE REM PT RTRN 9FT ADLT (ELECTROSURGICAL) ×1 IMPLANT
GAUZE SPONGE 4X4 12PLY STRL (GAUZE/BANDAGES/DRESSINGS) ×1 IMPLANT
GAUZE XEROFORM 1X8 LF (GAUZE/BANDAGES/DRESSINGS) ×1 IMPLANT
GLOVE SRG 8 PF TXTR STRL LF DI (GLOVE) ×2 IMPLANT
GLOVE SURG ENC MOIS LTX SZ7.5 (GLOVE) ×2 IMPLANT
GLOVE SURG UNDER POLY LF SZ8 (GLOVE) ×2
GOWN STRL REUS W/ TWL LRG LVL3 (GOWN DISPOSABLE) ×2 IMPLANT
GOWN STRL REUS W/TWL LRG LVL3 (GOWN DISPOSABLE) ×2
KIT TURNOVER KIT A (KITS) ×1 IMPLANT
LAPIPLASTY SYS 4A (Orthopedic Implant) ×1 IMPLANT
NS IRRIG 500ML POUR BTL (IV SOLUTION) ×1 IMPLANT
PACK EXTREMITY ARMC (MISCELLANEOUS) ×1 IMPLANT
RASP SM TEAR CROSS CUT (RASP) IMPLANT
SCREW 2.7 HIGH PITCH LOCKING (Screw) IMPLANT
SCREW HIGH PITCH LOCK 2.7 (Screw) IMPLANT
STOCKINETTE IMPERVIOUS LG (DRAPES) ×1 IMPLANT
STRIP CLOSURE SKIN 1/4X4 (GAUZE/BANDAGES/DRESSINGS) ×1 IMPLANT
SUT ETHILON 3-0 (SUTURE) IMPLANT
SUT MNCRL 4-0 (SUTURE) ×1
SUT MNCRL 4-0 27XMFL (SUTURE) ×1
SUT VIC AB 3-0 SH 27 (SUTURE) ×1
SUT VIC AB 3-0 SH 27X BRD (SUTURE) IMPLANT
SUT VIC AB 4-0 SH 27 (SUTURE) ×1
SUT VIC AB 4-0 SH 27XANBCTRL (SUTURE) IMPLANT
SUTURE MNCRL 4-0 27XMF (SUTURE) ×1 IMPLANT
SYSTEM LAPIPLASTY 4A (Orthopedic Implant) IMPLANT

## 2023-02-18 NOTE — H&P (Signed)
HISTORY AND PHYSICAL INTERVAL NOTE:  02/18/2023  11:50 AM  Traci May  has presented today for surgery, with the diagnosis of M79.671 -Foot pain, right.  The various methods of treatment have been discussed with the patient.  No guarantees were given.  After consideration of risks, benefits and other options for treatment, the patient has consented to surgery.  I have reviewed the patients' chart and labs.     A history and physical examination was performed in my office.  The patient was reexamined.  There have been no changes to this history and physical examination.  Traci May A

## 2023-02-18 NOTE — Anesthesia Postprocedure Evaluation (Signed)
Anesthesia Post Note  Patient: Traci May  Procedure(s) Performed: Maryclare Bean (Right: Toe)  Patient location during evaluation: PACU Anesthesia Type: General Level of consciousness: awake and alert Pain management: pain level controlled Vital Signs Assessment: post-procedure vital signs reviewed and stable Respiratory status: spontaneous breathing, nonlabored ventilation, respiratory function stable and patient connected to nasal cannula oxygen Cardiovascular status: blood pressure returned to baseline and stable Postop Assessment: no apparent nausea or vomiting Anesthetic complications: no   No notable events documented.   Last Vitals:  Vitals:   02/18/23 1400 02/18/23 1413  BP: 128/64 122/66  Pulse: 77 66  Resp: 13 13  Temp:  (!) 36.4 C  SpO2: 100% 100%    Last Pain:  Vitals:   02/18/23 1413  TempSrc:   PainSc: 2                  Precious Haws Dandria Griego

## 2023-02-18 NOTE — Transfer of Care (Signed)
Immediate Anesthesia Transfer of Care Note  Patient: Traci May  Procedure(s) Performed: Maryclare Bean (Right: Toe)  Patient Location: PACU  Anesthesia Type: General LMA  Level of Consciousness: awake, alert  and patient cooperative  Airway and Oxygen Therapy: Patient Spontanous Breathing and Patient connected to supplemental oxygen  Post-op Assessment: Post-op Vital signs reviewed, Patient's Cardiovascular Status Stable, Respiratory Function Stable, Patent Airway and No signs of Nausea or vomiting  Post-op Vital Signs: Reviewed and stable  Complications: No notable events documented.

## 2023-02-18 NOTE — Anesthesia Preprocedure Evaluation (Signed)
Anesthesia Evaluation  Patient identified by MRN, date of birth, ID band Patient awake    Reviewed: Allergy & Precautions, NPO status , Patient's Chart, lab work & pertinent test results  History of Anesthesia Complications Negative for: history of anesthetic complications  Airway Mallampati: II  TM Distance: >3 FB Neck ROM: full    Dental  (+) Chipped   Pulmonary neg pulmonary ROS, neg shortness of breath   Pulmonary exam normal        Cardiovascular Exercise Tolerance: Good (-) angina (-) Past MI negative cardio ROS Normal cardiovascular exam     Neuro/Psych negative neurological ROS  negative psych ROS   GI/Hepatic negative GI ROS, Neg liver ROS,,,  Endo/Other  negative endocrine ROS    Renal/GU      Musculoskeletal   Abdominal   Peds  Hematology negative hematology ROS (+)   Anesthesia Other Findings History reviewed. No pertinent past medical history.  Past Surgical History: 04/25/2020: BUNIONECTOMY; Left     Comment:  Procedure: LAPIDUS TYPE LEFT;  Surgeon: [name],               DPM;  Location: [place];  Service: Podiatry;               Laterality: Left;  general with local 04/25/2020: HALLUX VALGUS AKIN; Left     Comment:  Procedure: [place];  Surgeon: [name],              DPM;  Location: [place];  Service: Podiatry;               Laterality: Left;  BMI    Body Mass Index: 24.37 kg/m      Reproductive/Obstetrics negative OB ROS                             Anesthesia Physical Anesthesia Plan  ASA: 1  Anesthesia Plan: General LMA   Post-op Pain Management:    Induction: Intravenous  PONV Risk Score and Plan: Dexamethasone, Ondansetron, Midazolam and Treatment may vary due to age or medical condition  Airway Management Planned: LMA  Additional Equipment:   Intra-op Plan:   Post-operative Plan: Extubation in  OR  Informed Consent: I have reviewed the patients History and Physical, chart, labs and discussed the procedure including the risks, benefits and alternatives for the proposed anesthesia with the patient or authorized representative who has indicated his/her understanding and acceptance.     Dental Advisory Given  Plan Discussed with: Anesthesiologist, CRNA and Surgeon  Anesthesia Plan Comments: (Patient consented for risks of anesthesia including but not limited to:  - adverse reactions to medications - damage to eyes, teeth, lips or other oral mucosa - nerve damage due to positioning  - sore throat or hoarseness - Damage to heart, brain, nerves, lungs, other parts of body or loss of life  Patient voiced understanding.)       Anesthesia Quick Evaluation

## 2023-02-18 NOTE — Op Note (Signed)
Operative note   Surgeon:Latorria Zeoli Lawyer: None    Preop diagnosis: Hallux valgus deformity right foot    Postop diagnosis: Same    Procedure: Lapidus hallux valgus correction right foot    EBL: Minimal    Anesthesia:local and general.  Local consisted of a one-to-one mixture of 0.25% bupivacaine and Exparel long-acting anesthetic. a total of 20 cc was used    Hemostasis: [place] tourniquet inflated to 200 mmHg for 75 minutes    Specimen: None    Complications: None    Operative indications:Traci May is an 47 y.o. that presents today for surgical intervention.  The risks/benefits/alternatives/complications have been discussed and consent has been given.    Procedure:  Patient was brought into the OR and placed on the operating table in thesupine position. After anesthesia was obtained the right lower extremity was prepped and draped in usual sterile fashion.  Attention was directed to the dorsal aspect of the foot where a dorsal incision was made at the first met cuneiforms joint.  Sharp and blunt dissection was carried down to the periosteum.  Subperiosteal dissection was then undertaken.  This exposed the first met cuneiform joint.  This was then freed and loosened.  A small fulcrum was placed between the base of the first metatarsal and second metatarsal.  Next the joint positioner was placed on the medial aspect of the metatarsal.  A small stab incision was made at the second metatarsal.  Compression was placed for the positioner.  Good realignment of the first intermetatarsal angle was noted at this time.  Attention was then directed to the dorsomedial first MTPJ where an incision was performed.  Sharp and blunt dissection was carried down to the capsule.  The intermetatarsal space was then entered.  The conjoined tendon of the abductor was then freed from the base of the proximal phalanx.  Attention was to redirected to the first met cuneiform joint.  At this time  the osteotomy cut guide was placed into the joint region.  2 vertical cuts were then made.  The cartilage material was removed from the first met cuneiform joint and the joint was then prepped with a 2.0 mm drill bit.  The joint compressor was then placed.  Good compression and realignment was noted.  Next the medial and dorsal locking plates were then placed from the Gate set.  Realignment and stability was noted in all planes.  Attention was redirected to the dorsomedial first MTPJ.  A small T capsulotomy was performed.  The dorsomedial eminence was noted and transected and smoothed with a power rasp.  A small capsulorrhaphy was performed.  Closure was then performed after all areas were irrigated.  3-0 Vicryl for the capsular tissue.  4-0 Vicryl in subcutaneous tissue and a 4-0 Monocryl for the skin.     Patient tolerated the procedure and anesthesia well.  Was transported from the OR to the PACU with all vital signs stable and vascular status intact. To be discharged per routine protocol.  Will follow up in approximately 1 week in the outpatient clinic.

## 2023-02-19 ENCOUNTER — Encounter: Payer: Self-pay | Admitting: Podiatry

## 2023-02-24 DIAGNOSIS — M79671 Pain in right foot: Secondary | ICD-10-CM | POA: Diagnosis not present

## 2023-04-07 DIAGNOSIS — M2011 Hallux valgus (acquired), right foot: Secondary | ICD-10-CM | POA: Diagnosis not present

## 2023-04-07 DIAGNOSIS — M79671 Pain in right foot: Secondary | ICD-10-CM | POA: Diagnosis not present

## 2023-04-11 IMAGING — MG MM DIGITAL SCREENING BILAT W/ TOMO AND CAD
8 series · 9 of 24 positions shown · non-contrast
Comparison: Previous exam(s).

CLINICAL DATA: Screening.

EXAM:
DIGITAL SCREENING BILATERAL MAMMOGRAM WITH TOMOSYNTHESIS AND CAD
TECHNIQUE: Bilateral screening digital craniocaudal and mediolateral oblique
mammograms were obtained. Bilateral screening digital breast
tomosynthesis was performed. The images were evaluated with
computer-aided detection.

[R MLO synth-2D]
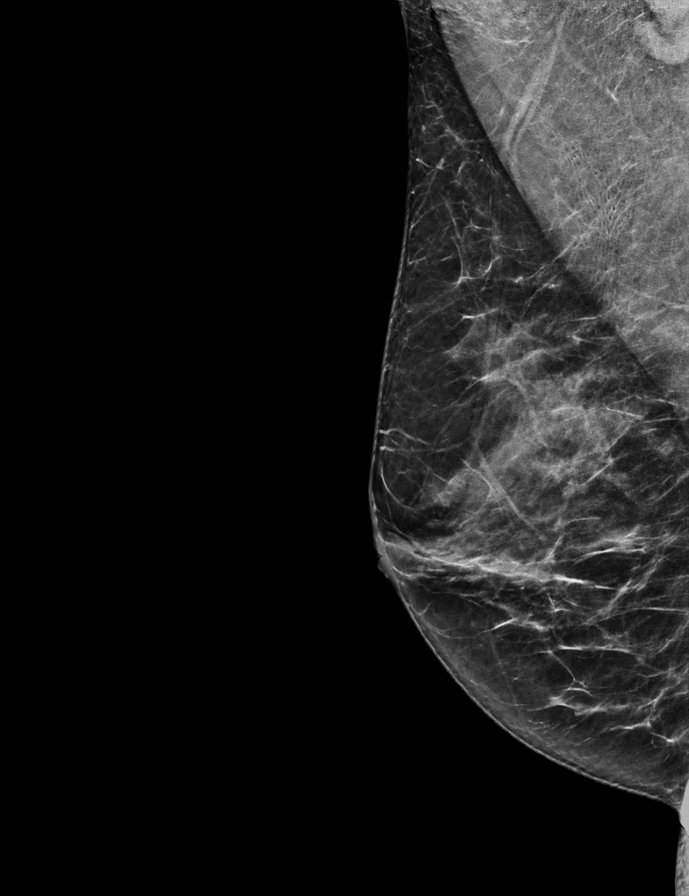

[R CC synth-2D]
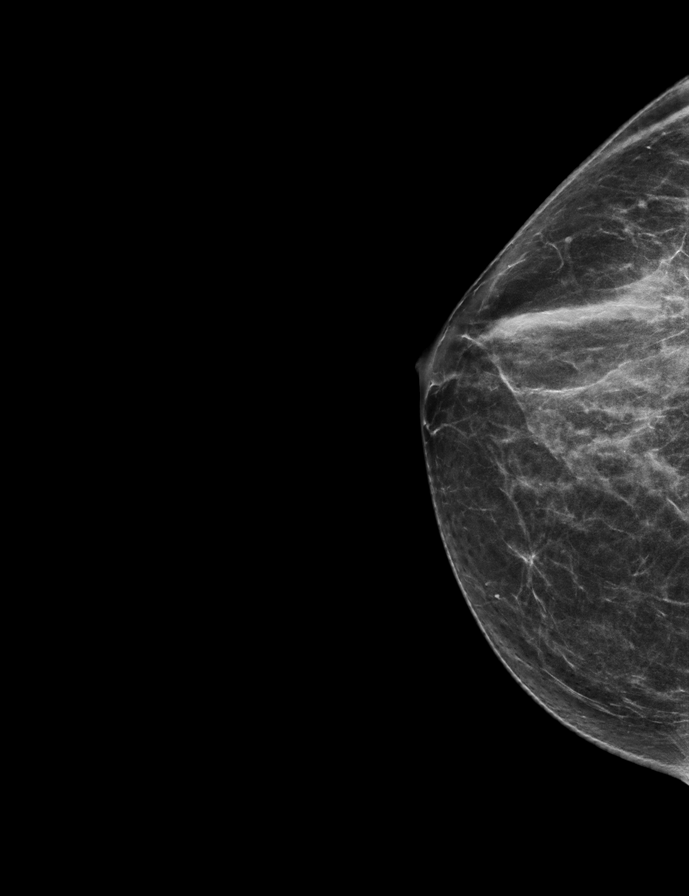

[L MLO synth-2D]
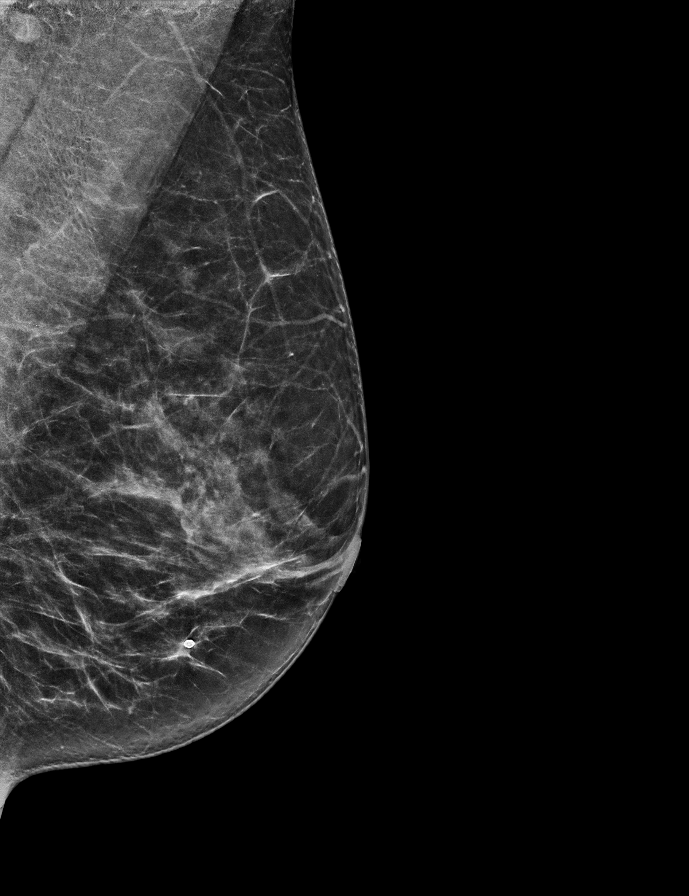

[L CC synth-2D]
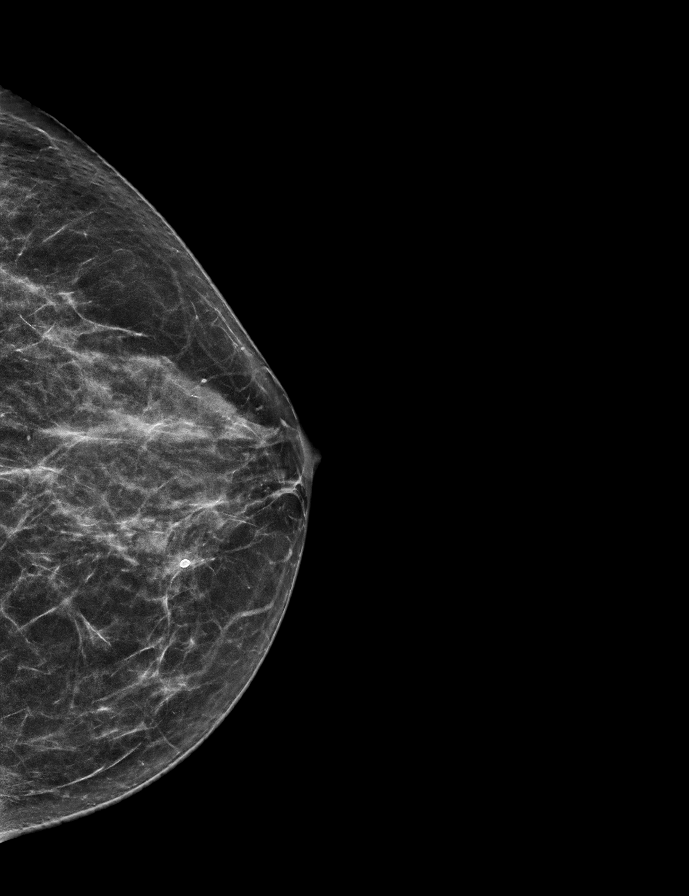

[L MLO tomo · 2 of 66 frames shown]
[frame 22/66]
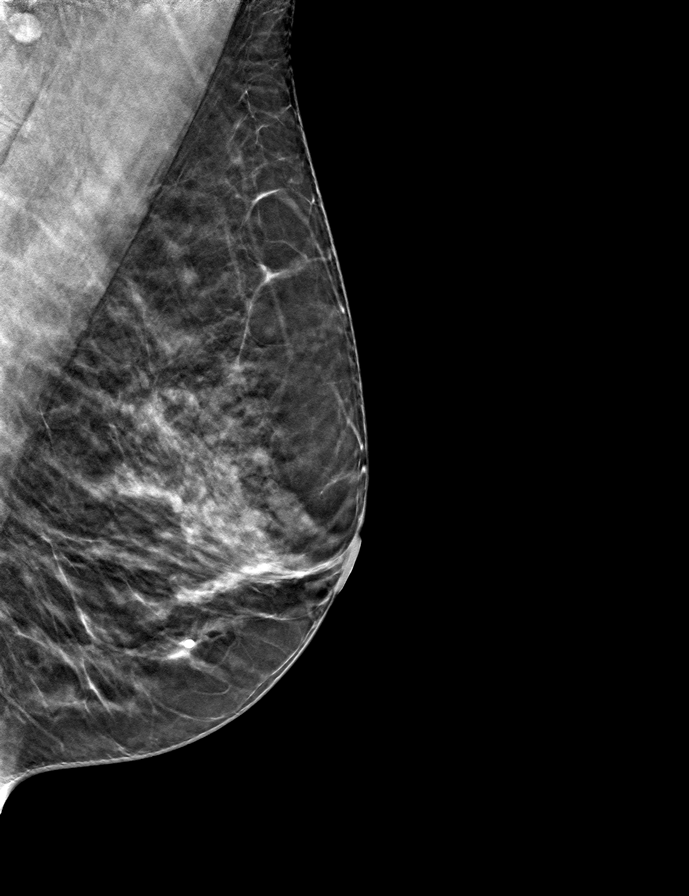
[frame 33/66]
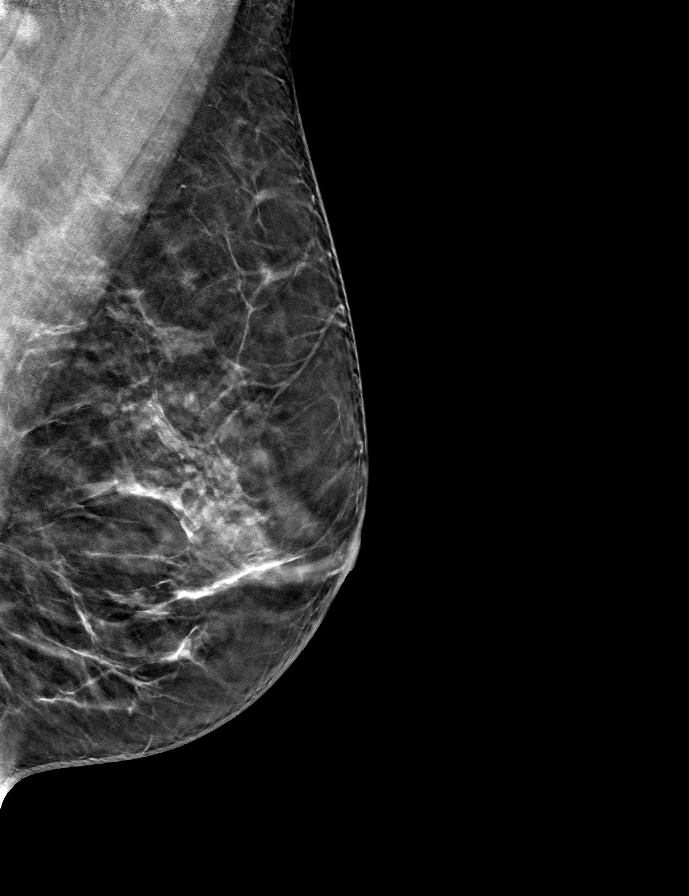

[L CC tomo · tomo slice 32/63.0]
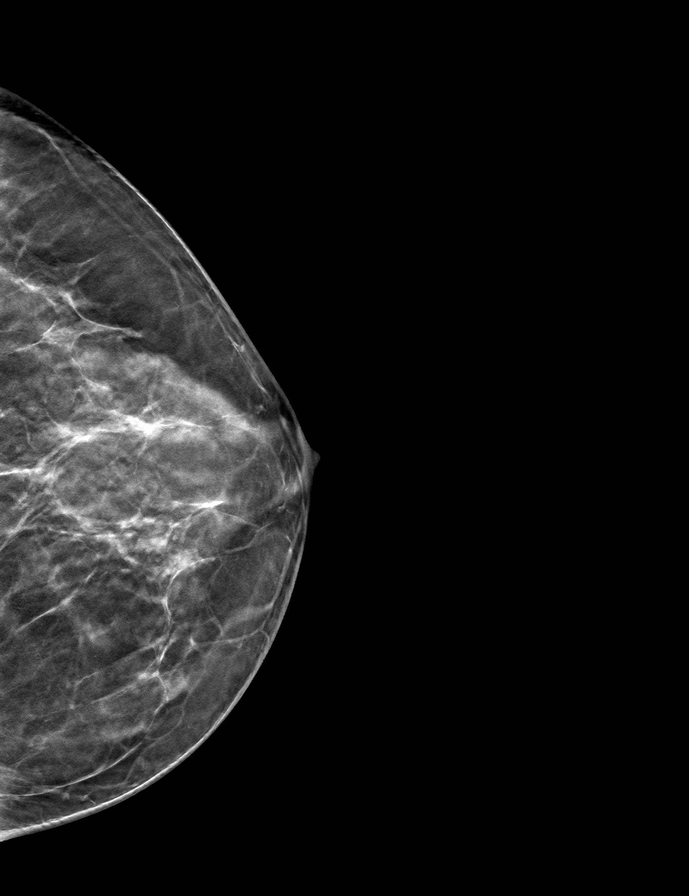

[R MLO tomo · tomo slice 33/64.0]
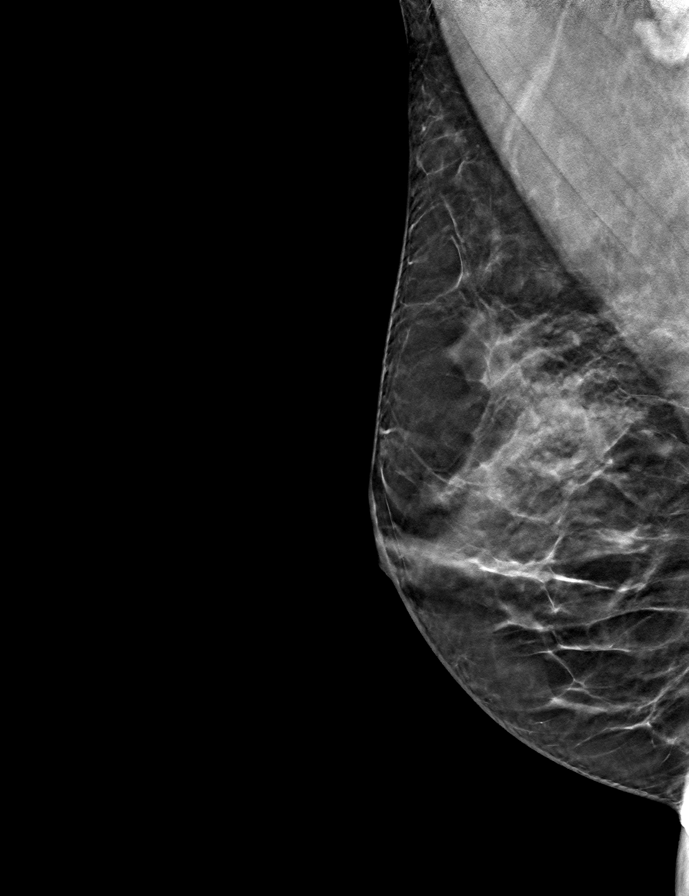

[R CC tomo · tomo slice 32/63.0]
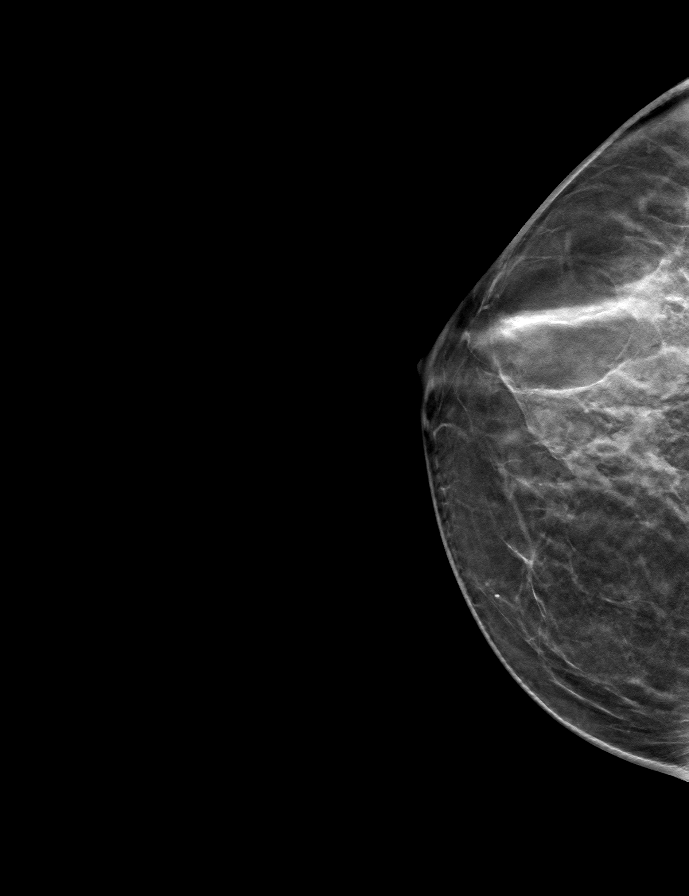

[9 of 24 positions shown; findings below may reference images not displayed]

ACR Breast Density Category b: There are scattered areas of
fibroglandular density.
FINDINGS: There are no findings suspicious for malignancy.
IMPRESSION: No mammographic evidence of malignancy. A result letter of this
screening mammogram will be mailed directly to the patient.

RECOMMENDATION:
Screening mammogram in one year. (Code:51-O-LD2)

BI-RADS CATEGORY  1: Negative.

## 2023-05-19 DIAGNOSIS — M2011 Hallux valgus (acquired), right foot: Secondary | ICD-10-CM | POA: Diagnosis not present

## 2023-05-26 ENCOUNTER — Other Ambulatory Visit: Payer: Self-pay | Admitting: Obstetrics and Gynecology

## 2023-05-26 DIAGNOSIS — Z1231 Encounter for screening mammogram for malignant neoplasm of breast: Secondary | ICD-10-CM

## 2023-06-09 ENCOUNTER — Ambulatory Visit
Admission: RE | Admit: 2023-06-09 | Discharge: 2023-06-09 | Disposition: A | Discharge status: Home / Self Care | Payer: Commercial Managed Care - PPO | Source: Ambulatory Visit | Attending: Obstetrics and Gynecology | Admitting: Obstetrics and Gynecology

## 2023-06-09 DIAGNOSIS — Z1231 Encounter for screening mammogram for malignant neoplasm of breast: Secondary | ICD-10-CM | POA: Diagnosis not present

## 2023-12-04 ENCOUNTER — Inpatient Hospital Stay (HOSPITAL_COMMUNITY)
Admission: EM | Admit: 2023-12-04 | Discharge: 2023-12-09 | DRG: 235 | Disposition: A | Discharge status: Home / Self Care | Payer: Commercial Managed Care - PPO | Source: Other Acute Inpatient Hospital | Attending: Cardiovascular Disease | Admitting: Cardiovascular Disease

## 2023-12-04 ENCOUNTER — Inpatient Hospital Stay
Admission: EM | Admit: 2023-12-04 | Discharge: 2023-12-04 | DRG: 282 | Disposition: A | Discharge status: Other Acute Inpatient Hospital | Payer: Commercial Managed Care - PPO | Attending: Obstetrics and Gynecology | Admitting: Obstetrics and Gynecology

## 2023-12-04 ENCOUNTER — Encounter: Payer: Self-pay | Admitting: Emergency Medicine

## 2023-12-04 ENCOUNTER — Other Ambulatory Visit: Payer: Self-pay

## 2023-12-04 ENCOUNTER — Inpatient Hospital Stay (HOSPITAL_COMMUNITY): Payer: Commercial Managed Care - PPO

## 2023-12-04 ENCOUNTER — Emergency Department: Payer: Commercial Managed Care - PPO

## 2023-12-04 ENCOUNTER — Encounter (HOSPITAL_COMMUNITY): Payer: Self-pay

## 2023-12-04 ENCOUNTER — Encounter
Admission: EM | Disposition: A | Discharge status: Other Acute Inpatient Hospital | Payer: Self-pay | Source: Home / Self Care | Attending: Obstetrics and Gynecology

## 2023-12-04 DIAGNOSIS — I214 Non-ST elevation (NSTEMI) myocardial infarction: Secondary | ICD-10-CM

## 2023-12-04 DIAGNOSIS — Z4682 Encounter for fitting and adjustment of non-vascular catheter: Secondary | ICD-10-CM | POA: Diagnosis not present

## 2023-12-04 DIAGNOSIS — J9811 Atelectasis: Secondary | ICD-10-CM | POA: Diagnosis not present

## 2023-12-04 DIAGNOSIS — D62 Acute posthemorrhagic anemia: Secondary | ICD-10-CM | POA: Diagnosis not present

## 2023-12-04 DIAGNOSIS — R079 Chest pain, unspecified: Principal | ICD-10-CM | POA: Diagnosis present

## 2023-12-04 DIAGNOSIS — Z452 Encounter for adjustment and management of vascular access device: Secondary | ICD-10-CM | POA: Diagnosis not present

## 2023-12-04 DIAGNOSIS — J811 Chronic pulmonary edema: Secondary | ICD-10-CM | POA: Diagnosis not present

## 2023-12-04 DIAGNOSIS — I2510 Atherosclerotic heart disease of native coronary artery without angina pectoris: Secondary | ICD-10-CM | POA: Diagnosis not present

## 2023-12-04 DIAGNOSIS — Z951 Presence of aortocoronary bypass graft: Secondary | ICD-10-CM

## 2023-12-04 DIAGNOSIS — J90 Pleural effusion, not elsewhere classified: Secondary | ICD-10-CM | POA: Diagnosis not present

## 2023-12-04 DIAGNOSIS — I2542 Coronary artery dissection: Principal | ICD-10-CM | POA: Diagnosis present

## 2023-12-04 DIAGNOSIS — R918 Other nonspecific abnormal finding of lung field: Secondary | ICD-10-CM | POA: Diagnosis not present

## 2023-12-04 DIAGNOSIS — R778 Other specified abnormalities of plasma proteins: Secondary | ICD-10-CM | POA: Diagnosis not present

## 2023-12-04 DIAGNOSIS — E8770 Fluid overload, unspecified: Secondary | ICD-10-CM | POA: Diagnosis not present

## 2023-12-04 DIAGNOSIS — I2119 ST elevation (STEMI) myocardial infarction involving other coronary artery of inferior wall: Secondary | ICD-10-CM | POA: Diagnosis not present

## 2023-12-04 DIAGNOSIS — R519 Headache, unspecified: Secondary | ICD-10-CM | POA: Diagnosis not present

## 2023-12-04 DIAGNOSIS — Z01810 Encounter for preprocedural cardiovascular examination: Secondary | ICD-10-CM

## 2023-12-04 DIAGNOSIS — J939 Pneumothorax, unspecified: Secondary | ICD-10-CM | POA: Diagnosis not present

## 2023-12-04 DIAGNOSIS — R0789 Other chest pain: Secondary | ICD-10-CM | POA: Diagnosis not present

## 2023-12-04 DIAGNOSIS — R7989 Other specified abnormal findings of blood chemistry: Secondary | ICD-10-CM

## 2023-12-04 DIAGNOSIS — Z48812 Encounter for surgical aftercare following surgery on the circulatory system: Secondary | ICD-10-CM | POA: Diagnosis not present

## 2023-12-04 HISTORY — PX: LEFT HEART CATH AND CORONARY ANGIOGRAPHY: CATH118249

## 2023-12-04 HISTORY — DX: Non-ST elevation (NSTEMI) myocardial infarction: I21.4

## 2023-12-04 LAB — HCG, QUANTITATIVE, PREGNANCY: hCG, Beta Chain, Quant, S: 5 m[IU]/mL — ABNORMAL HIGH (ref <5)

## 2023-12-04 LAB — CBC
HCT: 44.7 % (ref 36.0–46.0)
HCT: 44.8 % (ref 36.0–46.0)
Hemoglobin: 15 g/dL (ref 12.0–15.0)
Hemoglobin: 15.2 g/dL — ABNORMAL HIGH (ref 12.0–15.0)
MCH: 31 pg (ref 26.0–34.0)
MCH: 31.1 pg (ref 26.0–34.0)
MCHC: 33.6 g/dL (ref 30.0–36.0)
MCHC: 33.9 g/dL (ref 30.0–36.0)
MCV: 92.4 fL (ref 80.0–100.0)
MCV: 91.6 fL (ref 80.0–100.0)
Platelets: 357 10*3/uL (ref 150–400)
Platelets: 432 10*3/uL — ABNORMAL HIGH (ref 150–400)
RBC: 4.84 MIL/uL (ref 3.87–5.11)
RBC: 4.89 MIL/uL (ref 3.87–5.11)
RDW: 13.2 % (ref 11.5–15.5)
RDW: 13.3 % (ref 11.5–15.5)
WBC: 5.4 10*3/uL (ref 4.0–10.5)
WBC: 9.1 10*3/uL (ref 4.0–10.5)
nRBC: 0 % (ref 0.0–0.2)
nRBC: 0 % (ref 0.0–0.2)

## 2023-12-04 LAB — TROPONIN I (HIGH SENSITIVITY)
Troponin I (High Sensitivity): 8 ng/L (ref <18)
Troponin I (High Sensitivity): 320 ng/L (ref <18)
Troponin I (High Sensitivity): 362 ng/L (ref <18)

## 2023-12-04 LAB — BASIC METABOLIC PANEL
Anion gap: 11 (ref 5–15)
BUN: 17 mg/dL (ref 6–20)
CO2: 21 mmol/L — ABNORMAL LOW (ref 22–32)
Calcium: 8.9 mg/dL (ref 8.9–10.3)
Chloride: 107 mmol/L (ref 98–111)
Creatinine, Ser: 0.73 mg/dL (ref 0.44–1.00)
GFR, Estimated: >60 mL/min (ref >60)
Glucose, Bld: 107 mg/dL — ABNORMAL HIGH (ref 70–99)
Potassium: 3.8 mmol/L (ref 3.5–5.1)
Sodium: 139 mmol/L (ref 135–145)

## 2023-12-04 LAB — ECHOCARDIOGRAM COMPLETE
Area-P 1/2: 3.53 cm2
Height: 64 in
S' Lateral: 2.3 cm
Weight: 2151.69 [oz_av]

## 2023-12-04 LAB — TYPE AND SCREEN
ABO/RH(D): A POS
Antibody Screen: NEGATIVE

## 2023-12-04 LAB — CREATININE, SERUM
Creatinine, Ser: 0.82 mg/dL (ref 0.44–1.00)
GFR, Estimated: >60 mL/min (ref >60)

## 2023-12-04 LAB — D-DIMER, QUANTITATIVE: D-Dimer, Quant: <0.27 ug{FEU}/mL (ref 0.00–0.50)

## 2023-12-04 LAB — SURGICAL PCR SCREEN

## 2023-12-04 LAB — TSH: TSH: 1.857 u[IU]/mL (ref 0.350–4.500)

## 2023-12-04 LAB — BRAIN NATRIURETIC PEPTIDE: B Natriuretic Peptide: 28.5 pg/mL (ref 0.0–100.0)

## 2023-12-04 LAB — PROTIME-INR
INR: 1.2 (ref 0.8–1.2)
Prothrombin Time: 14.9 s (ref 11.4–15.2)

## 2023-12-04 LAB — APTT: aPTT: 35 s (ref 24–36)

## 2023-12-04 SURGERY — LEFT HEART CATH AND CORONARY ANGIOGRAPHY
Anesthesia: Moderate Sedation

## 2023-12-04 MED ORDER — HEPARIN (PORCINE) 25000 UT/250ML-% IV SOLN
750.0000 [IU]/h | INTRAVENOUS | Status: DC
  Administered 2023-12-04: 750 [IU]/h via INTRAVENOUS
  Filled 2023-12-04: qty 250

## 2023-12-04 MED ORDER — FENTANYL CITRATE (PF) 100 MCG/2ML IJ SOLN
INTRAMUSCULAR | Status: AC
  Filled 2023-12-04: qty 2

## 2023-12-04 MED ORDER — HEPARIN SODIUM (PORCINE) 1000 UNIT/ML IJ SOLN
INTRAMUSCULAR | Status: AC
  Filled 2023-12-04: qty 10

## 2023-12-04 MED ORDER — POTASSIUM CHLORIDE 2 MEQ/ML IV SOLN
80.0000 meq | INTRAVENOUS | Status: DC
  Filled 2023-12-04: qty 40

## 2023-12-04 MED ORDER — ACETAMINOPHEN 325 MG PO TABS
650.0000 mg | ORAL_TABLET | ORAL | Status: DC | PRN
Start: 2023-12-04 — End: 2023-12-04

## 2023-12-04 MED ORDER — PHENYLEPHRINE HCL-NACL 20-0.9 MG/250ML-% IV SOLN
30.0000 ug/min | INTRAVENOUS | Status: DC
  Filled 2023-12-04: qty 250

## 2023-12-04 MED ORDER — VERAPAMIL HCL 2.5 MG/ML IV SOLN
INTRAVENOUS | Status: AC
  Filled 2023-12-04: qty 2

## 2023-12-04 MED ORDER — METOPROLOL TARTRATE 12.5 MG HALF TABLET
12.5000 mg | ORAL_TABLET | Freq: Once | ORAL | Status: AC
Start: 2023-12-05 — End: 2023-12-05
  Administered 2023-12-05: 12.5 mg via ORAL
  Filled 2023-12-04: qty 1

## 2023-12-04 MED ORDER — DEXMEDETOMIDINE HCL IN NACL 400 MCG/100ML IV SOLN
0.1000 ug/kg/h | INTRAVENOUS | Status: DC
Start: 2023-12-05 — End: 2023-12-05
  Filled 2023-12-04: qty 100

## 2023-12-04 MED ORDER — ONDANSETRON HCL 4 MG/2ML IJ SOLN: 4.0000 mg | Freq: Four times a day (QID) | INTRAMUSCULAR | Status: DC | PRN

## 2023-12-04 MED ORDER — FUROSEMIDE 10 MG/ML IJ SOLN
INTRAMUSCULAR | Status: DC | PRN
  Administered 2023-12-04: 20 mg via INTRAVENOUS

## 2023-12-04 MED ORDER — NOREPINEPHRINE 4 MG/250ML-% IV SOLN
0.0000 ug/min | INTRAVENOUS | Status: DC
  Filled 2023-12-04: qty 250

## 2023-12-04 MED ORDER — TRANEXAMIC ACID 1000 MG/10ML IV SOLN
1.5000 mg/kg/h | INTRAVENOUS | Status: DC
  Filled 2023-12-04: qty 25

## 2023-12-04 MED ORDER — HEPARIN (PORCINE) IN NACL 1000-0.9 UT/500ML-% IV SOLN
INTRAVENOUS | Status: DC | PRN
  Administered 2023-12-04 (×2): 500 mL

## 2023-12-04 MED ORDER — TEMAZEPAM 15 MG PO CAPS: 15.0000 mg | ORAL_CAPSULE | Freq: Once | ORAL | Status: DC | PRN

## 2023-12-04 MED ORDER — ASPIRIN 81 MG PO CHEW: 81.0000 mg | CHEWABLE_TABLET | ORAL | Status: DC

## 2023-12-04 MED ORDER — NITROGLYCERIN 0.4 MG SL SUBL: 0.4000 mg | SUBLINGUAL_TABLET | SUBLINGUAL | Status: DC | PRN

## 2023-12-04 MED ORDER — HEPARIN SODIUM (PORCINE) 5000 UNIT/ML IJ SOLN
5000.0000 [IU] | Freq: Three times a day (TID) | INTRAMUSCULAR | Status: DC
  Administered 2023-12-04 – 2023-12-05 (×2): 5000 [IU] via SUBCUTANEOUS
  Filled 2023-12-04 (×2): qty 1

## 2023-12-04 MED ORDER — HEPARIN SODIUM (PORCINE) 1000 UNIT/ML IJ SOLN
INTRAMUSCULAR | Status: DC | PRN
  Administered 2023-12-04: 3000 [IU] via INTRAVENOUS

## 2023-12-04 MED ORDER — CHLORHEXIDINE GLUCONATE 0.12 % MT SOLN
15.0000 mL | Freq: Once | OROMUCOSAL | Status: AC
Start: 2023-12-05 — End: 2023-12-05
  Administered 2023-12-05: 15 mL via OROMUCOSAL
  Filled 2023-12-04: qty 15

## 2023-12-04 MED ORDER — MIDAZOLAM HCL 2 MG/2ML IJ SOLN
INTRAMUSCULAR | Status: DC | PRN
  Administered 2023-12-04: 1 mg via INTRAVENOUS
  Administered 2023-12-04: .5 mg via INTRAVENOUS

## 2023-12-04 MED ORDER — MILRINONE LACTATE IN DEXTROSE 20-5 MG/100ML-% IV SOLN
0.3000 ug/kg/min | INTRAVENOUS | Status: DC
  Filled 2023-12-04: qty 100

## 2023-12-04 MED ORDER — SODIUM CHLORIDE 0.9 % WEIGHT BASED INFUSION
3.0000 mL/kg/h | INTRAVENOUS | Status: AC
Start: 2023-12-04 — End: 2023-12-04
  Administered 2023-12-04: 3 mL/kg/h via INTRAVENOUS

## 2023-12-04 MED ORDER — VANCOMYCIN HCL 1000 MG IV SOLR
INTRAVENOUS | Status: DC
  Filled 2023-12-04: qty 20

## 2023-12-04 MED ORDER — ACETAMINOPHEN 325 MG PO TABS: 650.0000 mg | ORAL_TABLET | ORAL | Status: DC | PRN

## 2023-12-04 MED ORDER — ASPIRIN 81 MG PO TBEC: 81.0000 mg | DELAYED_RELEASE_TABLET | Freq: Every day | ORAL | Status: DC

## 2023-12-04 MED ORDER — MAGNESIUM SULFATE 50 % IJ SOLN
40.0000 meq | INTRAMUSCULAR | Status: DC
  Filled 2023-12-04: qty 9.85

## 2023-12-04 MED ORDER — VANCOMYCIN HCL 1250 MG/250ML IV SOLN
1250.0000 mg | INTRAVENOUS | Status: DC
  Filled 2023-12-04: qty 250

## 2023-12-04 MED ORDER — CEFAZOLIN SODIUM-DEXTROSE 2-4 GM/100ML-% IV SOLN
2.0000 g | INTRAVENOUS | Status: DC
  Filled 2023-12-04: qty 100

## 2023-12-04 MED ORDER — BISACODYL 5 MG PO TBEC
5.0000 mg | DELAYED_RELEASE_TABLET | Freq: Once | ORAL | Status: AC
Start: 2023-12-04 — End: 2023-12-04
  Administered 2023-12-04: 5 mg via ORAL
  Filled 2023-12-04: qty 1

## 2023-12-04 MED ORDER — MIDAZOLAM HCL 2 MG/2ML IJ SOLN
INTRAMUSCULAR | Status: AC
  Filled 2023-12-04: qty 2

## 2023-12-04 MED ORDER — TRANEXAMIC ACID (OHS) PUMP PRIME SOLUTION
2.0000 mg/kg | INTRAVENOUS | Status: DC
  Filled 2023-12-04: qty 1.22

## 2023-12-04 MED ORDER — TRANEXAMIC ACID (OHS) BOLUS VIA INFUSION
15.0000 mg/kg | INTRAVENOUS | Status: DC
  Filled 2023-12-04: qty 915

## 2023-12-04 MED ORDER — LIDOCAINE HCL (PF) 1 % IJ SOLN
INTRAMUSCULAR | Status: DC | PRN
  Administered 2023-12-04: 2 mL

## 2023-12-04 MED ORDER — SODIUM CHLORIDE 0.9% FLUSH: 3.0000 mL | INTRAVENOUS | Status: DC | PRN

## 2023-12-04 MED ORDER — CHLORHEXIDINE GLUCONATE CLOTH 2 % EX PADS
6.0000 | MEDICATED_PAD | Freq: Once | CUTANEOUS | Status: AC
Start: 2023-12-04 — End: 2023-12-04
  Administered 2023-12-04: 6 via TOPICAL

## 2023-12-04 MED ORDER — HEPARIN (PORCINE) IN NACL 1000-0.9 UT/500ML-% IV SOLN
INTRAVENOUS | Status: AC
Start: 2023-12-04 — End: ?
  Filled 2023-12-04: qty 1000

## 2023-12-04 MED ORDER — VERAPAMIL HCL 2.5 MG/ML IV SOLN
INTRAVENOUS | Status: DC | PRN
  Administered 2023-12-04: 2.5 mg via INTRA_ARTERIAL

## 2023-12-04 MED ORDER — ASPIRIN 81 MG PO CHEW
324.0000 mg | CHEWABLE_TABLET | Freq: Once | ORAL | Status: AC
  Administered 2023-12-04: 324 mg via ORAL
  Filled 2023-12-04: qty 4

## 2023-12-04 MED ORDER — EPINEPHRINE HCL 5 MG/250ML IV SOLN IN NS
0.0000 ug/min | INTRAVENOUS | Status: DC
  Filled 2023-12-04: qty 250

## 2023-12-04 MED ORDER — SODIUM CHLORIDE 0.9 % IV SOLN: 250.0000 mL | INTRAVENOUS | Status: DC | PRN

## 2023-12-04 MED ORDER — NITROGLYCERIN IN D5W 200-5 MCG/ML-% IV SOLN
2.0000 ug/min | INTRAVENOUS | Status: DC
Start: 2023-12-05 — End: 2023-12-05
  Filled 2023-12-04: qty 250

## 2023-12-04 MED ORDER — SODIUM CHLORIDE 0.9 % IV SOLN
INTRAVENOUS | Status: AC | PRN
  Administered 2023-12-04: 20 mL/h via INTRAVENOUS

## 2023-12-04 MED ORDER — FUROSEMIDE 10 MG/ML IJ SOLN
INTRAMUSCULAR | Status: AC
  Filled 2023-12-04: qty 4

## 2023-12-04 MED ORDER — IOHEXOL 300 MG/ML  SOLN
INTRAMUSCULAR | Status: DC | PRN
  Administered 2023-12-04: 76 mL

## 2023-12-04 MED ORDER — SODIUM CHLORIDE 0.9% FLUSH
3.0000 mL | Freq: Two times a day (BID) | INTRAVENOUS | Status: DC
  Administered 2023-12-04: 3 mL via INTRAVENOUS

## 2023-12-04 MED ORDER — CHLORHEXIDINE GLUCONATE CLOTH 2 % EX PADS
6.0000 | MEDICATED_PAD | Freq: Once | CUTANEOUS | Status: AC
Start: 2023-12-05 — End: 2023-12-05
  Administered 2023-12-05: 6 via TOPICAL

## 2023-12-04 MED ORDER — HEPARIN 30,000 UNITS/1000 ML (OHS) CELLSAVER SOLUTION
Status: DC
  Filled 2023-12-04: qty 1000

## 2023-12-04 MED ORDER — HEPARIN BOLUS VIA INFUSION
3700.0000 [IU] | Freq: Once | INTRAVENOUS | Status: AC
  Administered 2023-12-04: 3700 [IU] via INTRAVENOUS
  Filled 2023-12-04: qty 3700

## 2023-12-04 MED ORDER — INSULIN REGULAR(HUMAN) IN NACL 100-0.9 UT/100ML-% IV SOLN
INTRAVENOUS | Status: DC
  Filled 2023-12-04: qty 100

## 2023-12-04 MED ORDER — SODIUM CHLORIDE 0.9 % WEIGHT BASED INFUSION: 1.0000 mL/kg/h | INTRAVENOUS | Status: DC

## 2023-12-04 MED ORDER — PLASMA-LYTE A IV SOLN
INTRAVENOUS | Status: DC
  Filled 2023-12-04: qty 2.5

## 2023-12-04 MED ORDER — FENTANYL CITRATE (PF) 100 MCG/2ML IJ SOLN
INTRAMUSCULAR | Status: DC | PRN
  Administered 2023-12-04: 50 ug via INTRAVENOUS
  Administered 2023-12-04: 12.5 ug via INTRAVENOUS

## 2023-12-04 SURGICAL SUPPLY — 10 items
CATH 5FR JL3.5 JR4 ANG PIG MP (CATHETERS) IMPLANT
DEVICE RAD TR BAND REGULAR (VASCULAR PRODUCTS) IMPLANT
DRAPE BRACHIAL (DRAPES) IMPLANT
GLIDESHEATH SLEND SS 6F .021 (SHEATH) IMPLANT
GUIDEWIRE INQWIRE 1.5J.035X260 (WIRE) IMPLANT
INQWIRE 1.5J .035X260CM (WIRE) ×1
PACK CARDIAC CATH (CUSTOM PROCEDURE TRAY) ×1 IMPLANT
PROTECTION STATION PRESSURIZED (MISCELLANEOUS) ×1
SET ATX-X65L (MISCELLANEOUS) IMPLANT
STATION PROTECTION PRESSURIZED (MISCELLANEOUS) IMPLANT

## 2023-12-04 NOTE — H&P (View-Only) (Signed)
[address]       [place] [address]             [phone]           AYISHA MILAND [facility] Medical Record #[id number] Date of Birth: 01/14/1976  Jodelle Red,* Pcp, No  Chief Complaint:    Chief Complaint  Patient presents with   NSTEMI    History of Present Illness:     Pt is a 47 yo female with acute onset of CP associated with Nausea, lightheadedness as walking up a flight of stairs this am going to work. She was seen in the ER with nonspecific EKG findings and had a rising troponin to over 300. She was taken to the cath lab where a very proximal LAD stenosis (SCAD) was found with evidence of hypokinetic distal anterior wall. She has remained pain free for the past several hours. She has no family history of dissections and her grandfather has had CABG      No past medical history on file.  Past Surgical History:  Procedure Laterality Date   BUNIONECTOMY Left 04/25/2020   Procedure: LAPIDUS TYPE LEFT;  Surgeon: Gwyneth Revels, DPM;  Location: [facility];  Service: Podiatry;  Laterality: Left;  general with local   BUNIONECTOMY Right 02/18/2023   Procedure: BUNIONECTOMY - LAPIDUS;  Surgeon: Gwyneth Revels, DPM;  Location: [facility];  Service: Podiatry;  Laterality: Right;   HALLUX VALGUS AKIN Left 04/25/2020   Procedure: HALLUX VALGUS AKIN;  Surgeon: Gwyneth Revels, DPM;  Location: [facility];  Service: Podiatry;  Laterality: Left;    Social History   Tobacco Use  Smoking Status Never  Smokeless Tobacco Never    Social History   Substance and Sexual Activity  Alcohol Use Yes   Comment: occasional    Social History   Socioeconomic History   Marital status: Single    Spouse name: Not on file   Number of children: Not on file   Years of education: Not on file   Highest education level: Not on file  Occupational History   Not on file  Tobacco Use   Smoking status: Never   Smokeless tobacco:  Never  Vaping Use   Vaping status: Never Used  Substance and Sexual Activity   Alcohol use: Yes    Comment: occasional   Drug use: Never   Sexual activity: Not on file  Other Topics Concern   Not on file  Social History Narrative   Not on file   Social Determinants of Health   Financial Resource Strain: Not on file  Food Insecurity: Not on file  Transportation Needs: Not on file  Physical Activity: Not on file  Stress: Not on file  Social Connections: Not on file  Intimate Partner Violence: Not on file    No Known Allergies  No current facility-administered medications for this encounter.     Family History  Problem Relation Age of Onset   Breast cancer Neg Hx        Physical Exam: LMP 02/09/2019 Comment: postmenopause Lungs; clear Card: rr  Ext: warm Neuro: intact    Diagnostic Studies & Laboratory data: I have personally reviewed the following studies and agree with the findings     Recent Radiology Findings:   CARDIAC CATHETERIZATION  Result Date: 12/04/2023   Ost LAD to Prox LAD lesion is 95% stenosed.   There is mild to moderate left ventricular systolic dysfunction.  The left ventricular ejection fraction is 35-45% by visual estimate. 1.  Spontaneous coronary artery dissection ostium LAD 2.  Mild to moderate reduced left ventricular function with anterior apical hypokinesis Recommendations 1.  Transfer to [facility] for possible emergent CABG 2.  Defer IABP at this time, unless patient becomes clinically and/or hemodynamically unstable   DG Chest 2 View  Result Date: 12/04/2023 CLINICAL DATA:  Chest pain EXAM: CHEST - 2 VIEW COMPARISON:  None Available. FINDINGS: The heart size and mediastinal contours are within normal limits. Both lungs are clear. The visualized skeletal structures are unremarkable. IMPRESSION: No active cardiopulmonary disease. Electronically Signed   By: Kennith Center M.D.   On: 12/04/2023 06:24      Recent Lab  Findings: Lab Results  Component Value Date   WBC 5.4 12/04/2023   HGB 15.0 12/04/2023   HCT 44.7 12/04/2023   PLT 357 12/04/2023   GLUCOSE 107 (H) 12/04/2023   NA 139 12/04/2023   K 3.8 12/04/2023   CL 107 12/04/2023   CREATININE 0.73 12/04/2023   BUN 17 12/04/2023   CO2 21 (L) 12/04/2023   TSH 1.857 12/04/2023   INR 1.2 12/04/2023      Assessment / Plan:     46 yo female with acute CP and now with ostial LAD stenosis (possible SCAD) and slightly depressed LV function. She is at risk for propogation of the dissection if present and that concern along with position of stenosis would not be best to allow it to heal over the next few days. I have recommended CABG with LIMA to LAD. She understands all the risks and goals and recovery of the surgery and agrees to proceed. With her pain free will proceed in the am.   I have spent 60 min in review of the records, viewing studies and in face to face with patient and in coordination of future care    Eugenio Hoes 12/04/2023 5:32 PM

## 2023-12-04 NOTE — ED Provider Notes (Signed)
[facility] Provider Note    Event Date/Time   First MD Initiated Contact with Patient 12/04/23 [phone]     (approximate)   History   Chest Pain   HPI  Traci May is a 47 y.o. female no significant past medical history presents to the emergency department with chest pain.  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  Felt like it was difficult to take a deep breath.  Mild nausea at that time.  Since coming down to the emergency department and resting states that she no longer has any symptoms.  Some mild chest discomfort.  Denies any significant shortness of breath, nausea, vomiting or diaphoresis.  Denies any abdominal pain.  Denies any swelling to her leg.  No recent trip or travel.  No history of DVT or PE.  Does not take any estrogen supplements.  No daily medications.  Denies any tobacco use.  No family history of coronary artery disease at a young age.     Physical Exam   Triage Vital Signs: ED Triage Vitals  Encounter Vitals Group     BP 12/04/23 0555 128/83     Systolic BP Percentile --      Diastolic BP Percentile --      Pulse Rate 12/04/23 0555 73     Resp 12/04/23 0555 18     Temp 12/04/23 0555 98.2 F (36.8 C)     Temp Source 12/04/23 0555 Oral     SpO2 12/04/23 0555 99 %     Weight 12/04/23 0556 135 lb (61.2 kg)     Height 12/04/23 0556 5\' 4"  (1.626 m)     Head Circumference --      Peak Flow --      Pain Score 12/04/23 0556 3     Pain Loc --      Pain Education --      Exclude from Growth Chart --     Most recent vital signs: Vitals:   12/04/23 0555 12/04/23 1020  BP: 128/83 120/80  Pulse: 73 70  Resp: 18 18  Temp: 98.2 F (36.8 C) 98 F (36.7 C)  SpO2: 99% 99%    Physical Exam Constitutional:      Appearance: She is well-developed.  HENT:     Head: Atraumatic.  Eyes:     Conjunctiva/sclera: Conjunctivae normal.   Cardiovascular:     Rate and Rhythm: Regular rhythm.  Pulmonary:     Effort: No respiratory distress.  Abdominal:     General: There is no distension.     Palpations: Abdomen is soft.  Musculoskeletal:        General: Normal range of motion.     Cervical back: Normal range of motion.     Right lower leg: No edema.     Left lower leg: No edema.  Skin:    General: Skin is warm.     Capillary Refill: Capillary refill takes less than 2 seconds.  Neurological:     Mental Status: She is alert. Mental status is at baseline.    IMPRESSION / MDM / ASSESSMENT AND PLAN / ED COURSE  I reviewed the triage vital signs and the nursing notes.  Differential diagnosis including ACS, pulmonary embolism, pneumonia, pericarditis, anemia, Prinzmetal  EKG  I, Corena Herter, the attending physician, personally viewed and interpreted this ECG.   Rate: Normal  Rhythm: Normal sinus  Axis: Normal  Intervals: Normal  ST&T Change: None   RADIOLOGY I independently reviewed imaging, my interpretation of imaging: Chest x-ray with no signs of pneumonia  LABS (all labs ordered are listed, but only abnormal results are displayed) Labs interpreted as -    Labs Reviewed  BASIC METABOLIC PANEL - Abnormal; Notable for the following components:      Result Value   CO2 21 (*)    Glucose, Bld 107 (*)    All other components within normal limits  TROPONIN I (HIGH SENSITIVITY) - Abnormal; Notable for the following components:   Troponin I (High Sensitivity) 320 (*)    All other components within normal limits  CBC  D-DIMER, QUANTITATIVE  APTT  PROTIME-INR  BRAIN NATRIURETIC PEPTIDE  HEPARIN LEVEL (UNFRACTIONATED)  TSH  URINE DRUG SCREEN, QUALITATIVE ([facility] ONLY)  HCG, QUANTITATIVE, PREGNANCY  TROPONIN I (HIGH SENSITIVITY)  TROPONIN I (HIGH SENSITIVITY)     MDM  Patient currently chest pain-free.  EKG without findings of acute dysrhythmia.  Initial troponin was negative.  Low risk Wells  criteria and D-dimer is negative have a low suspicion for pulmonary embolism.  No signs of anemia or leukocytosis.  Repeat troponin significantly elevated at 320.  Concern for possible NSTEMI versus Prinzmetal angina.  Discussed with cardiology on-call Dr. Melton Alar who recommended heparin with admission to the hospitalist with possible plan for cardiac catheterization.  Patient mated to the hospitalist.  Started on heparin and remains chest pain-free.     PROCEDURES:  Critical Care performed: yes  .Critical Care  Performed by: Corena Herter, MD Authorized by: Corena Herter, MD   Critical care provider statement:    Critical care time (minutes):  45   Critical care time was exclusive of:  Separately billable procedures and treating other patients   Critical care was necessary to treat or prevent imminent or life-threatening deterioration of the following conditions:  Cardiac failure   Critical care was time spent personally by me on the following activities:  Development of treatment plan with patient or surrogate, discussions with consultants, evaluation of patient's response to treatment, examination of patient, ordering and review of laboratory studies, ordering and review of radiographic studies, ordering and performing treatments and interventions, pulse oximetry, re-evaluation of patient's condition and review of old charts   Care discussed with: admitting provider     Patient's presentation is most consistent with acute presentation with potential threat to life or bodily function.   MEDICATIONS ORDERED IN ED: Medications  heparin ADULT infusion 100 units/mL (25000 units/238mL) (750 Units/hr Intravenous New Bag/Given 12/04/23 1131)  aspirin EC tablet 81 mg (has no administration in time range)  nitroGLYCERIN (NITROSTAT) SL tablet 0.4 mg (has no administration in time range)  acetaminophen (TYLENOL) tablet 650 mg (has no administration in time range)  ondansetron (ZOFRAN)  injection 4 mg (has no administration in time range)  aspirin chewable tablet 324 mg (324 mg Oral Given 12/04/23 1113)  heparin bolus via infusion 3,700 Units (3,700 Units Intravenous Bolus from Bag 12/04/23 1131)    FINAL CLINICAL IMPRESSION(S) / ED DIAGNOSES   Final diagnoses:  Chest pain, unspecified type  Elevated troponin  NSTEMI (non-ST elevated myocardial infarction) (HCC)     Rx / DC Orders   ED Discharge Orders     None        Note:  This document was prepared using Dragon voice recognition software and may include unintentional dictation errors.   Corena Herter, MD 12/04/23 [phone]

## 2023-12-04 NOTE — ED Notes (Signed)
See triage note Presents with chest pain and nausea  States she noticed this with walking

## 2023-12-04 NOTE — Consult Note (Addendum)
PHARMACY - ANTICOAGULATION CONSULT NOTE  Pharmacy Consult for Heparin Indication: chest pain/ACS  No Known Allergies  Patient Measurements: Height: 5\' 4"  (162.6 cm) Weight: 61.2 kg (135 lb) IBW/kg (Calculated) : 54.7 Heparin Dosing Weight: 61.2 kg   Vital Signs: Temp: 98 F (36.7 C) (12/06 1020) Temp Source: Oral (12/06 1020) BP: 120/80 (12/06 1020) Pulse Rate: 70 (12/06 1020)  Labs: Recent Labs    12/04/23 0552 12/04/23 0935  HGB 15.0  --   HCT 44.7  --   PLT 357  --   CREATININE 0.73  --   TROPONINIHS 8 320*   Estimated Creatinine Clearance: 75.9 mL/min (by C-G formula based on SCr of 0.73 mg/dL).  Medical History: History reviewed. No pertinent past medical history.  Medications:  No PTA anticoagulation  Assessment: Traci May is a 47 year old female that presented with new onset chest pain accompanied by nausea and pain when trying to take a deep breath. Patient with elevated troponin of 320. From chart review, patient is not on any anticoagulation at home. Pharmacy has been consulted for initiation and management of a heparin infusion. Baseline labs: Hgb 15.0, PLT 357, aPTT and PT/INR ordered.   Goal of Therapy:  Heparin level 0.3-0.7 units/ml Monitor platelets by anticoagulation protocol: Yes   Plan:  Give bolus of 3700 units x 1 Start heparin infusion at 750 units/hr Check HL 6 hours after initiation of infusion Monitor HL and CBC daily   Littie Deeds, PharmD Pharmacy Resident  12/04/2023 10:50 AM

## 2023-12-04 NOTE — Progress Notes (Signed)
  Echocardiogram 2D Echocardiogram has been performed.  Delcie Roch 12/04/2023, 7:07 PM

## 2023-12-04 NOTE — Consult Note (Signed)
[facility] CLINIC CARDIOLOGY CONSULT NOTE       Patient ID: Traci May MRN: [id number] DOB/AGE: 1976-06-01 47 y.o.  Admit date: 12/04/2023 Referring Physician Dr. Corena Herter Primary Physician Pcp, No  Primary Cardiologist None Reason for Consultation chest pain, elevated troponin  HPI: Traci May is a 47 y.o. female  with no significant past medical history who presented to the ED on 12/04/2023 for chest pain. Troponins uptrended 8 >320. Cardiology was consulted for further evaluation.   Patient reports that when she came to work this morning and she was walking up the stairs she had onset of sudden chest tightness that got worse as she kept walking.  Describes it as sharp at times but also pressure.  Reports that it felt like "someone standing on her chest".  Blood pressure was checked at that time and found to be elevated.  She endorses mild associated shortness of breath with episode.  She works here at [employer] and came to the ED for further evaluation after this.  Workup in the ED notable for creatinine 0.73, potassium 3.8, sodium 139, hemoglobin 15.0, WBC 5.4.  D-dimer negative.  Troponins trended 8 > 320.  EKG with normal sinus rhythm, nondiagnostic.  CXR unremarkable.  At the time of my evaluation in the ED patient is resting comfortably in ED stretcher.  States the pain is improved at this time.  We discussed her episode in detail.  States that with the episode she also had nausea and dizziness.  She denies palpitations but states that her heart rate was at 115 on her watch.  She denies any prior episodes similar to this.  She denies any past medical history, states that she does not take any medications.  She has never smoked.  She does report family history of a grandfather with coronary disease but nothing other than this.  Review of systems complete and found to be negative unless listed above    History reviewed. No pertinent past medical history.  Past Surgical History:   Procedure Laterality Date   BUNIONECTOMY Left 04/25/2020   Procedure: LAPIDUS TYPE LEFT;  Surgeon: Gwyneth Revels, DPM;  Location: [facility];  Service: Podiatry;  Laterality: Left;  general with local   BUNIONECTOMY Right 02/18/2023   Procedure: BUNIONECTOMY - LAPIDUS;  Surgeon: Gwyneth Revels, DPM;  Location: [facility];  Service: Podiatry;  Laterality: Right;   HALLUX VALGUS AKIN Left 04/25/2020   Procedure: HALLUX VALGUS AKIN;  Surgeon: Gwyneth Revels, DPM;  Location: [facility];  Service: Podiatry;  Laterality: Left;    (Not in a hospital admission)  Social History   Socioeconomic History   Marital status: Single    Spouse name: Not on file   Number of children: Not on file   Years of education: Not on file   Highest education level: Not on file  Occupational History   Not on file  Tobacco Use   Smoking status: Never   Smokeless tobacco: Never  Vaping Use   Vaping status: Never Used  Substance and Sexual Activity   Alcohol use: Yes    Comment: occasional   Drug use: Never   Sexual activity: Not on file  Other Topics Concern   Not on file  Social History Narrative   Not on file   Social Determinants of Health   Financial Resource Strain: Not on file  Food Insecurity: Not on file  Transportation Needs: Not on file  Physical Activity: Not on file  Stress: Not  on file  Social Connections: Not on file  Intimate Partner Violence: Not on file    Family History  Problem Relation Age of Onset   Breast cancer Neg Hx      Vitals:   12/04/23 0555 12/04/23 0556 12/04/23 1020  BP: 128/83  120/80  Pulse: 73  70  Resp: 18  18  Temp: 98.2 F (36.8 C)  98 F (36.7 C)  TempSrc: Oral  Oral  SpO2: 99%  99%  Weight:  61.2 kg   Height:  5\' 4"  (1.626 m)     PHYSICAL EXAM General: Well appearing female, well nourished, in no acute distress. HEENT: Normocephalic and atraumatic. Neck: No JVD.  Lungs: Normal respiratory effort on room air. Clear  bilaterally to auscultation. No wheezes, crackles, rhonchi.  Heart: HRRR. Normal S1 and S2 without gallops or murmurs.  Abdomen: Non-distended appearing.  Msk: Normal strength and tone for age. Extremities: Warm and well perfused. No clubbing, cyanosis. No edema.  Neuro: Alert and oriented X 3. Psych: Answers questions appropriately.   Labs: Basic Metabolic Panel: Recent Labs    12/04/23 0552  NA 139  K 3.8  CL 107  CO2 21*  GLUCOSE 107*  BUN 17  CREATININE 0.73  CALCIUM 8.9   Liver Function Tests: No results for input(s): "AST", "ALT", "ALKPHOS", "BILITOT", "PROT", "ALBUMIN" in the last 72 hours. No results for input(s): "LIPASE", "AMYLASE" in the last 72 hours. CBC: Recent Labs    12/04/23 0552  WBC 5.4  HGB 15.0  HCT 44.7  MCV 92.4  PLT 357   Cardiac Enzymes: Recent Labs    12/04/23 0552 12/04/23 0935  TROPONINIHS 8 320*   BNP: No results for input(s): "BNP" in the last 72 hours. D-Dimer: Recent Labs    12/04/23 0935  DDIMER <0.27   Hemoglobin A1C: No results for input(s): "HGBA1C" in the last 72 hours. Fasting Lipid Panel: No results for input(s): "CHOL", "HDL", "LDLCALC", "TRIG", "CHOLHDL", "LDLDIRECT" in the last 72 hours. Thyroid Function Tests: No results for input(s): "TSH", "T4TOTAL", "T3FREE", "THYROIDAB" in the last 72 hours.  Invalid input(s): "FREET3" Anemia Panel: No results for input(s): "VITAMINB12", "FOLATE", "FERRITIN", "TIBC", "IRON", "RETICCTPCT" in the last 72 hours.   Radiology: DG Chest 2 View  Result Date: 12/04/2023 CLINICAL DATA:  Chest pain EXAM: CHEST - 2 VIEW COMPARISON:  None Available. FINDINGS: The heart size and mediastinal contours are within normal limits. Both lungs are clear. The visualized skeletal structures are unremarkable. IMPRESSION: No active cardiopulmonary disease. Electronically Signed   By: Kennith Center M.D.   On: 12/04/2023 06:24    ECHO pending  TELEMETRY reviewed by me 12/04/2023: not on tele  EKG  reviewed by me: sinus rhythm rate 70 bpm  Data reviewed by me 12/04/2023: last 24h vitals tele labs imaging I/O ED provider note, admission H&P  Principal Problem:   NSTEMI (non-ST elevated myocardial infarction) [facility]) Active Problems:   Chest pain    ASSESSMENT AND PLAN:  Traci May is a 47 y.o. female  with no significant past medical history who presented to the ED on 12/04/2023 for chest pain. Troponins uptrended 8 >320. Cardiology was consulted for further evaluation.   # NSTEMI Patient with no significant past medical history presented to the ED today after an episode of chest pressure while walking upstairs.  Troponins trended 8 > 320 with repeats pending.  EKG with normal sinus rhythm, nondiagnostic. -Check lipid panel, A1c.  -S/p ASA 325 in the ED.  Continue heparin infusion -Prn SL nitroglycerin -Continuous telemetry monitoring. -Echocardiogram ordered -Discussed the risks and benefits of proceeding with LHC for further evaluation with the patient.  She is agreeable to proceed.  NPO until LHC this afternoon (12/6) with Dr. Darrold Junker.  Written consent will be obtained.  Further recommendations following LHC.     TIMI Risk Score for Unstable Angina or Non-ST Elevation MI:   The patient's TIMI risk score is 1, which indicates a 5% risk of all cause mortality, new or recurrent myocardial infarction or need for urgent revascularization in the next 14 days.   This patient's plan of care was discussed and created with Dr. Melton Alar and she is in agreement.  Signed: Gale Journey, PA-C  12/04/2023, 11:54 AM [facility]

## 2023-12-04 NOTE — Anesthesia Preprocedure Evaluation (Signed)
Anesthesia Evaluation  Patient identified by MRN, date of birth, ID band Patient awake    Reviewed: Allergy & Precautions, NPO status , Patient's Chart, lab work & pertinent test results  History of Anesthesia Complications Negative for: history of anesthetic complications  Airway Mallampati: II  TM Distance: >3 FB Neck ROM: Full    Dental no notable dental hx.    Pulmonary neg pulmonary ROS, neg COPD   breath sounds clear to auscultation       Cardiovascular + CAD and + Past MI   Rhythm:Regular Rate:Normal   '24 TTE - EF 60 to 65%. A small pericardial effusion is present. Trivial MR.  '24 Cath -   Ost LAD to Prox LAD lesion is 95% stenosed.   There is mild to moderate left ventricular systolic dysfunction.   The left ventricular ejection fraction is 35-45% by visual estimate. 1.  Spontaneous coronary artery dissection ostium LAD 2.  Mild to moderate reduced left ventricular function with anterior apical hypokinesis     Neuro/Psych neg Seizures negative neurological ROS  negative psych ROS   GI/Hepatic negative GI ROS, Neg liver ROS,,,  Endo/Other  negative endocrine ROS    Renal/GU negative Renal ROS     Musculoskeletal negative musculoskeletal ROS (+)    Abdominal   Peds  Hematology negative hematology ROS (+)   Anesthesia Other Findings   Reproductive/Obstetrics                             Anesthesia Physical Anesthesia Plan  ASA: 4  Anesthesia Plan: General   Post-op Pain Management: Minimal or no pain anticipated   Induction: Intravenous  PONV Risk Score and Plan: 3 and Treatment may vary due to age or medical condition  Airway Management Planned: Oral ETT  Additional Equipment: Arterial line, CVP, PA Cath, TEE and Ultrasound Guidance Line Placement  Intra-op Plan:   Post-operative Plan: Post-operative intubation/ventilation  Informed Consent: I have reviewed  the patients History and Physical, chart, labs and discussed the procedure including the risks, benefits and alternatives for the proposed anesthesia with the patient or authorized representative who has indicated his/her understanding and acceptance.     Dental advisory given  Plan Discussed with: CRNA and Anesthesiologist  Anesthesia Plan Comments:        Anesthesia Quick Evaluation

## 2023-12-04 NOTE — ED Triage Notes (Signed)
Patient wheeled to triage with complaints of chest pain and nausea that occurred this morning while climbing the steps to come into work. She states it came on suddenly accompanied by nausea and pain when trying to take a deep breath. Denies prior hx and denies taking any meds at home. Patient states pain has subsided since sitting in wheelchair.

## 2023-12-04 NOTE — Consult Note (Signed)
[address]       [place] [address]             [phone]           Traci May [facility] Medical Record #[id number] Date of Birth: 01/14/1976  Traci May,* Pcp, No  Chief Complaint:    Chief Complaint  Patient presents with   NSTEMI    History of Present Illness:     Pt is a 47 yo female with acute onset of CP associated with Nausea, lightheadedness as walking up a flight of stairs this am going to work. She was seen in the ER with nonspecific EKG findings and had a rising troponin to over 300. She was taken to the cath lab where a very proximal LAD stenosis (SCAD) was found with evidence of hypokinetic distal anterior wall. She has remained pain free for the past several hours. She has no family history of dissections and her grandfather has had CABG      No past medical history on file.  Past Surgical History:  Procedure Laterality Date   BUNIONECTOMY Left 04/25/2020   Procedure: LAPIDUS TYPE LEFT;  Surgeon: Gwyneth Revels, DPM;  Location: [facility];  Service: Podiatry;  Laterality: Left;  general with local   BUNIONECTOMY Right 02/18/2023   Procedure: BUNIONECTOMY - LAPIDUS;  Surgeon: Gwyneth Revels, DPM;  Location: [facility];  Service: Podiatry;  Laterality: Right;   HALLUX VALGUS AKIN Left 04/25/2020   Procedure: HALLUX VALGUS AKIN;  Surgeon: Gwyneth Revels, DPM;  Location: [facility];  Service: Podiatry;  Laterality: Left;    Social History   Tobacco Use  Smoking Status Never  Smokeless Tobacco Never    Social History   Substance and Sexual Activity  Alcohol Use Yes   Comment: occasional    Social History   Socioeconomic History   Marital status: Single    Spouse name: Not on file   Number of children: Not on file   Years of education: Not on file   Highest education level: Not on file  Occupational History   Not on file  Tobacco Use   Smoking status: Never   Smokeless tobacco:  Never  Vaping Use   Vaping status: Never Used  Substance and Sexual Activity   Alcohol use: Yes    Comment: occasional   Drug use: Never   Sexual activity: Not on file  Other Topics Concern   Not on file  Social History Narrative   Not on file   Social Determinants of Health   Financial Resource Strain: Not on file  Food Insecurity: Not on file  Transportation Needs: Not on file  Physical Activity: Not on file  Stress: Not on file  Social Connections: Not on file  Intimate Partner Violence: Not on file    No Known Allergies  No current facility-administered medications for this encounter.     Family History  Problem Relation Age of Onset   Breast cancer Neg Hx        Physical Exam: LMP 02/09/2019 Comment: postmenopause Lungs; clear Card: rr  Ext: warm Neuro: intact    Diagnostic Studies & Laboratory data: I have personally reviewed the following studies and agree with the findings     Recent Radiology Findings:   CARDIAC CATHETERIZATION  Result Date: 12/04/2023   Ost LAD to Prox LAD lesion is 95% stenosed.   There is mild to moderate left ventricular systolic dysfunction.  The left ventricular ejection fraction is 35-45% by visual estimate. 1.  Spontaneous coronary artery dissection ostium LAD 2.  Mild to moderate reduced left ventricular function with anterior apical hypokinesis Recommendations 1.  Transfer to [facility] for possible emergent CABG 2.  Defer IABP at this time, unless patient becomes clinically and/or hemodynamically unstable   DG Chest 2 View  Result Date: 12/04/2023 CLINICAL DATA:  Chest pain EXAM: CHEST - 2 VIEW COMPARISON:  None Available. FINDINGS: The heart size and mediastinal contours are within normal limits. Both lungs are clear. The visualized skeletal structures are unremarkable. IMPRESSION: No active cardiopulmonary disease. Electronically Signed   By: Kennith Center M.D.   On: 12/04/2023 06:24      Recent Lab  Findings: Lab Results  Component Value Date   WBC 5.4 12/04/2023   HGB 15.0 12/04/2023   HCT 44.7 12/04/2023   PLT 357 12/04/2023   GLUCOSE 107 (H) 12/04/2023   NA 139 12/04/2023   K 3.8 12/04/2023   CL 107 12/04/2023   CREATININE 0.73 12/04/2023   BUN 17 12/04/2023   CO2 21 (L) 12/04/2023   TSH 1.857 12/04/2023   INR 1.2 12/04/2023      Assessment / Plan:     46 yo female with acute CP and now with ostial LAD stenosis (possible SCAD) and slightly depressed LV function. She is at risk for propogation of the dissection if present and that concern along with position of stenosis would not be best to allow it to heal over the next few days. I have recommended CABG with LIMA to LAD. She understands all the risks and goals and recovery of the surgery and agrees to proceed. With her pain free will proceed in the am.   I have spent 60 min in review of the records, viewing studies and in face to face with patient and in coordination of future care    Eugenio Hoes 12/04/2023 5:32 PM

## 2023-12-04 NOTE — H&P (Signed)
History and Physical    Traci May BJY:[id number] DOB: 09/24/1976 DOA: 12/04/2023  PCP: Pcp, No  Patient coming from: home   Chief Complaint: chest pain  HPI: Traci May is a 47 y.o. female with medical history significant for nothing presentings with the above.  Is in her usual state of health, no recent new meds, no toxic habits, no recent illness, awoke feeling well today. No fam hx heart disease, strength trains regularly without chest pain. But today when climbing stairs developed non-radiating substernal chest pain/pressure. It lasted about 30 minutes and has resolved. No fever or cough.    Review of Systems: As per HPI otherwise 10 point review of systems negative.    History reviewed. No pertinent past medical history.  Past Surgical History:  Procedure Laterality Date   BUNIONECTOMY Left 04/25/2020   Procedure: LAPIDUS TYPE LEFT;  Surgeon: Gwyneth Revels, DPM;  Location: [facility];  Service: Podiatry;  Laterality: Left;  general with local   BUNIONECTOMY Right 02/18/2023   Procedure: BUNIONECTOMY - LAPIDUS;  Surgeon: Gwyneth Revels, DPM;  Location: [facility];  Service: Podiatry;  Laterality: Right;   HALLUX VALGUS AKIN Left 04/25/2020   Procedure: HALLUX VALGUS AKIN;  Surgeon: Gwyneth Revels, DPM;  Location: [facility];  Service: Podiatry;  Laterality: Left;     reports that she has never smoked. She has never used smokeless tobacco. She reports current alcohol use. She reports that she does not use drugs.  No Known Allergies  Family History  Problem Relation Age of Onset   Breast cancer Neg Hx     Prior to Admission medications   Medication Sig Start Date End Date Taking? Authorizing Provider  Multiple Vitamin (MULTIVITAMIN) capsule Take 1 capsule by mouth daily.    [provider]    Physical Exam: Vitals:   12/04/23 0555 12/04/23 0556 12/04/23 1020  BP: 128/83  120/80  Pulse: 73  70  Resp: 18  18  Temp: 98.2 F  (36.8 C)  98 F (36.7 C)  TempSrc: Oral  Oral  SpO2: 99%  99%  Weight:  61.2 kg   Height:  5\' 4"  (1.626 m)     Constitutional: No acute distress Head: Atraumatic Eyes: Conjunctiva clear Respiratory: Clear to auscultation bilaterally, no wheezing/rales/rhonchi. Normal respiratory effort. No accessory muscle use. . Cardiovascular: Regular rate and rhythm. No murmurs/rubs/gallops. Abdomen: Non-tender, non-distended.   Musculoskeletal: No joint deformity upper and lower extremities.   Skin: No rashes, lesions, or ulcers.  Extremities: No peripheral edema.   Neurologic: Alert, moving all 4 extremities. Psychiatric: Normal insight and judgement.   Labs on Admission: I have personally reviewed following labs and imaging studies  CBC: Recent Labs  Lab 12/04/23 0552  WBC 5.4  HGB 15.0  HCT 44.7  MCV 92.4  PLT 357   Basic Metabolic Panel: Recent Labs  Lab 12/04/23 0552  NA 139  K 3.8  CL 107  CO2 21*  GLUCOSE 107*  BUN 17  CREATININE 0.73  CALCIUM 8.9   GFR: Estimated Creatinine Clearance: 75.9 mL/min (by C-G formula based on SCr of 0.73 mg/dL). Liver Function Tests: No results for input(s): "AST", "ALT", "ALKPHOS", "BILITOT", "PROT", "ALBUMIN" in the last 168 hours. No results for input(s): "LIPASE", "AMYLASE" in the last 168 hours. No results for input(s): "AMMONIA" in the last 168 hours. Coagulation Profile: No results for input(s): "INR", "PROTIME" in the last 168 hours. Cardiac Enzymes: No results for input(s): "CKTOTAL", "CKMB", "CKMBINDEX", "TROPONINI" in the  last 168 hours. BNP (last 3 results) No results for input(s): "PROBNP" in the last 8760 hours. HbA1C: No results for input(s): "HGBA1C" in the last 72 hours. CBG: No results for input(s): "GLUCAP" in the last 168 hours. Lipid Profile: No results for input(s): "CHOL", "HDL", "LDLCALC", "TRIG", "CHOLHDL", "LDLDIRECT" in the last 72 hours. Thyroid Function Tests: No results for input(s): "TSH",  "T4TOTAL", "FREET4", "T3FREE", "THYROIDAB" in the last 72 hours. Anemia Panel: No results for input(s): "VITAMINB12", "FOLATE", "FERRITIN", "TIBC", "IRON", "RETICCTPCT" in the last 72 hours. Urine analysis: No results found for: "COLORURINE", "APPEARANCEUR", "LABSPEC", "PHURINE", "GLUCOSEU", "HGBUR", "BILIRUBINUR", "KETONESUR", "PROTEINUR", "UROBILINOGEN", "NITRITE", "LEUKOCYTESUR"  Radiological Exams on Admission: DG Chest 2 View  Result Date: 12/04/2023 CLINICAL DATA:  Chest pain EXAM: CHEST - 2 VIEW COMPARISON:  None Available. FINDINGS: The heart size and mediastinal contours are within normal limits. Both lungs are clear. The visualized skeletal structures are unremarkable. IMPRESSION: No active cardiopulmonary disease. Electronically Signed   By: Kennith Center M.D.   On: 12/04/2023 06:24    EKG: Independently reviewed. nsr  Assessment/Plan Principal Problem:   Chest pain Active Problems:   NSTEMI (non-ST elevated myocardial infarction) (HCC)    # NSTEMI Exertional substernal chest pain today with troponin 8>320. No dyspnea, cxr clear, dimer neg. No signs heart failure. Denies drug use. Aspirin given - cardiology consulted. - heparin ordered - tentative plan for LHC - f/u TTE - f/u A1c and lipid panel and uds - f/u hcg  DVT prophylaxis: iv heparin Code Status: full  Family Communication: husband updated at bedside  Consults called: cardiology   Level of care: Telemetry Cardiac Status is: inpt    Silvano Bilis MD [facility] Pager [phone]  If 7PM-7AM, please contact night-coverage [email or web address] Password [place]  12/04/2023, 11:38 AM

## 2023-12-04 NOTE — ED Provider Triage Note (Signed)
Emergency Medicine Provider Triage Evaluation Note  Traci May , a 47 y.o. female  was evaluated in triage.  Pt complains of chest pain that began 520am while walking in from the parking lot. Center of her chest, non-radiating, and lasted 40 minutes. No SOB. No N/V, no diaphoresis. No medical problems. Resolved, feels back to normal now. No history of PE/DVT  Review of Systems  Positive: Chest pain Negative: Abd pain  Physical Exam  BP 128/83   Pulse 73   Temp 98.2 F (36.8 C) (Oral)   Resp 18   Ht 5\' 4"  (1.626 m)   Wt 61.2 kg   LMP 02/09/2019 Comment: postmenopause  SpO2 99%   BMI 23.17 kg/m  Gen:   Awake, no distress   Resp:  Normal effort  MSK:   Moves extremities without difficulty  Other:    Medical Decision Making  Medically screening exam initiated at 8:18 AM.  Appropriate orders placed.  ARLAINE MORMANDO was informed that the remainder of the evaluation will be completed by another provider, this initial triage assessment does not replace that evaluation, and the importance of remaining in the ED until their evaluation is complete.     Jackelyn Hoehn, PA-C 12/04/23 [phone]

## 2023-12-04 NOTE — Discharge Summary (Signed)
Traci May QMV:[id number] DOB: 1976/11/04 DOA: 12/04/2023  PCP: Pcp, No  Admit date: 12/04/2023 Discharge date: 12/04/2023  Time spent: 35 minutes     Discharge Diagnoses:  Principal Problem:   Spontaneous dissection of coronary artery Active Problems:   Chest pain   NSTEMI (non-ST elevated myocardial infarction) [facility])   Discharge Condition: stable  Diet recommendation: npo  Filed Weights   12/04/23 0556 12/04/23 1259  Weight: 61.2 kg 61.2 kg    History of present illness:  From admission h and p  Is in her usual state of health, no recent new meds, no toxic habits, no recent illness, awoke feeling well today. No fam hx heart disease, strength trains regularly without chest pain. But today when climbing stairs developed non-radiating substernal chest pain/pressure. It lasted about 30 minutes and has resolved. No fever or cough.   Hospital Course:  No PMH, presents with exertional substernal chest pain the morning of admission, now resolved. No ischemic changes on EKG but troponin elevation to 300s. Given aspirin, started on heparin, and taken for cath, which unfortunately shows ostial LAD SCAD. Dr. Darrold Junker has arranged urgent transfer to [facility] for further management.   Procedures: Left heart cath   Consultations: cardiology  Discharge Exam: Vitals:   12/04/23 1259 12/04/23 1343  BP: 137/70   Pulse: 75   Resp: 16   Temp: 98.3 F (36.8 C)   SpO2: 97% 99%    General: NAD Cardiovascular: RRR Respiratory: CTAB  Discharge Instructions   Discharge Instructions     Diet - low sodium heart healthy   Complete by: As directed    Increase activity slowly   Complete by: As directed       Allergies as of 12/04/2023   No Known Allergies      Medication List     TAKE these medications    multivitamin capsule Take 1 capsule by mouth daily.       No Known Allergies    The results of significant diagnostics from this hospitalization  (including imaging, microbiology, ancillary and laboratory) are listed below for reference.    Significant Diagnostic Studies: DG Chest 2 View  Result Date: 12/04/2023 CLINICAL DATA:  Chest pain EXAM: CHEST - 2 VIEW COMPARISON:  None Available. FINDINGS: The heart size and mediastinal contours are within normal limits. Both lungs are clear. The visualized skeletal structures are unremarkable. IMPRESSION: No active cardiopulmonary disease. Electronically Signed   By: Kennith Center M.D.   On: 12/04/2023 06:24    Microbiology: No results found for this or any previous visit (from the past 240 hour(s)).   Labs: Basic Metabolic Panel: Recent Labs  Lab 12/04/23 0552  NA 139  K 3.8  CL 107  CO2 21*  GLUCOSE 107*  BUN 17  CREATININE 0.73  CALCIUM 8.9   Liver Function Tests: No results for input(s): "AST", "ALT", "ALKPHOS", "BILITOT", "PROT", "ALBUMIN" in the last 168 hours. No results for input(s): "LIPASE", "AMYLASE" in the last 168 hours. No results for input(s): "AMMONIA" in the last 168 hours. CBC: Recent Labs  Lab 12/04/23 0552  WBC 5.4  HGB 15.0  HCT 44.7  MCV 92.4  PLT 357   Cardiac Enzymes: No results for input(s): "CKTOTAL", "CKMB", "CKMBINDEX", "TROPONINI" in the last 168 hours. BNP: BNP (last 3 results) Recent Labs    12/04/23 0552  BNP 28.5    ProBNP (last 3 results) No results for input(s): "PROBNP" in the last 8760 hours.  CBG: No results  for input(s): "GLUCAP" in the last 168 hours.     Signed:  Silvano Bilis MD.  [facility] 12/04/2023, 3:21 PM

## 2023-12-04 NOTE — H&P (Addendum)
Cardiology Admission History and Physical   Patient ID: Traci May MRN: [id number]; DOB: 14-Jun-1976   Admission date: 12/04/2023  PCP:  Oneita Hurt, No   [facility] Providers Cardiologist:  None   Chief Complaint: Spontaneous coronary artery dissection  Patient Profile:   Traci May is a 47 y.o. female with no past medical history who is being seen 12/04/2023 for the evaluation of spontaneous coronary artery dissection.  History of Present Illness:   Ms. Traci May has no prior cardiac history and denies any family history of cardiac disease.  She denies being a diabetic, smoker, drinker, drug use.  Currently patient being evaluated for SCAD.  She presented with acute onset of chest pain associated with significant nausea while walking up a flight of stairs.  In the ER she had an EKG that showed subtle ST elevation inferolaterally with no reciprocal changes, troponin elevated to 300s.  She was given aspirin and started on IV heparin and taken for cardiac catheterization that showed proximal LAD scad with hypokinetic distal anterior wall.  EF was reported to have been 35 to 40%.  She was transferred from [facility] to [facility] for CABG with LIMA to LAD.  Patient denies any symptoms right now.  Denies any chest pain, shortness of breath, palpitations, dizziness, peripheral edema, nausea.  Vital signs stable.  Patient feels well otherwise.  Troponins 320-262.  Normal renal function, normal CBC. TSH 1.857.  hCG beta chain 5.  She reports that there is no chance of pregnancy.  Also reports that she had early menopause and has not had a period in over 6 years.  No past medical history on file.  Past Surgical History:  Procedure Laterality Date   BUNIONECTOMY Left 04/25/2020   Procedure: LAPIDUS TYPE LEFT;  Surgeon: Gwyneth Revels, DPM;  Location: [facility];  Service: Podiatry;  Laterality: Left;  general with local   BUNIONECTOMY Right 02/18/2023   Procedure:  BUNIONECTOMY - LAPIDUS;  Surgeon: Gwyneth Revels, DPM;  Location: [facility];  Service: Podiatry;  Laterality: Right;   HALLUX VALGUS AKIN Left 04/25/2020   Procedure: HALLUX VALGUS AKIN;  Surgeon: Gwyneth Revels, DPM;  Location: [facility];  Service: Podiatry;  Laterality: Left;     Medications Prior to Admission: Prior to Admission medications   Medication Sig Start Date End Date Taking? Authorizing Provider  Multiple Vitamin (MULTIVITAMIN) capsule Take 1 capsule by mouth daily.    [provider]     Allergies:   No Known Allergies  Social History:   Social History   Socioeconomic History   Marital status: Single    Spouse name: Not on file   Number of children: Not on file   Years of education: Not on file   Highest education level: Not on file  Occupational History   Not on file  Tobacco Use   Smoking status: Never   Smokeless tobacco: Never  Vaping Use   Vaping status: Never Used  Substance and Sexual Activity   Alcohol use: Yes    Comment: occasional   Drug use: Never   Sexual activity: Not on file  Other Topics Concern   Not on file  Social History Narrative   Not on file   Social Determinants of Health   Financial Resource Strain: Not on file  Food Insecurity: Not on file  Transportation Needs: Not on file  Physical Activity: Not on file  Stress: Not on file  Social Connections: Not on file  Intimate Partner Violence: Not on file    Family History:   The patient's family history is negative for Breast cancer.    ROS:  Please see the history of present illness.  All other ROS reviewed and negative.     Physical Exam/Data:  There were no vitals filed for this visit. No intake or output data in the 24 hours ending 12/04/23 1819    12/04/2023    3:56 PM 12/04/2023   12:59 PM 12/04/2023    5:56 AM  Last 3 Weights  Weight (lbs) 134 lb 7.7 oz 134 lb 14.7 oz 135 lb  Weight (kg) 61 kg 61.2 kg 61.236 kg     There is no height  or weight on file to calculate BMI.  General:  Well nourished, well developed, in no acute distress HEENT: normal Neck: no JVD Vascular: No carotid bruits; Distal pulses 2+ bilaterally   Cardiac:  normal S1, S2; RRR; no murmur  Lungs:  clear to auscultation bilaterally, no wheezing, rhonchi or rales  Abd: soft, nontender, no hepatomegaly  Ext: no edema Musculoskeletal:  No deformities, BUE and BLE strength normal and equal Skin: warm and dry  Neuro:  CNs 2-12 intact, no focal abnormalities noted Psych:  Normal affect    EKG: Normal sinus rhythm, subtle ST elevation inferolaterally.  Heart rate 70s  Relevant CV Studies:   Laboratory Data:  High Sensitivity Troponin:   Recent Labs  Lab 12/04/23 0552 12/04/23 0935 12/04/23 1125  TROPONINIHS 8 320* 362*      Chemistry Recent Labs  Lab 12/04/23 0552  NA 139  K 3.8  CL 107  CO2 21*  GLUCOSE 107*  BUN 17  CREATININE 0.73  CALCIUM 8.9  GFRNONAA >60  ANIONGAP 11    No results for input(s): "PROT", "ALBUMIN", "AST", "ALT", "ALKPHOS", "BILITOT" in the last 168 hours. Lipids No results for input(s): "CHOL", "TRIG", "HDL", "LABVLDL", "LDLCALC", "CHOLHDL" in the last 168 hours. Hematology Recent Labs  Lab 12/04/23 0552  WBC 5.4  RBC 4.84  HGB 15.0  HCT 44.7  MCV 92.4  MCH 31.0  MCHC 33.6  RDW 13.2  PLT 357   Thyroid  Recent Labs  Lab 12/04/23 1125  TSH 1.857   BNP Recent Labs  Lab 12/04/23 0552  BNP 28.5    DDimer  Recent Labs  Lab 12/04/23 0935  DDIMER <0.27     Radiology/Studies:  CARDIAC CATHETERIZATION  Result Date: 12/04/2023   Ost LAD to Prox LAD lesion is 95% stenosed.   There is mild to moderate left ventricular systolic dysfunction.   The left ventricular ejection fraction is 35-45% by visual estimate. 1.  Spontaneous coronary artery dissection ostium LAD 2.  Mild to moderate reduced left ventricular function with anterior apical hypokinesis Recommendations 1.  Transfer to [facility] for possible emergent CABG 2.  Defer IABP at this time, unless patient becomes clinically and/or hemodynamically unstable   DG Chest 2 View  Result Date: 12/04/2023 CLINICAL DATA:  Chest pain EXAM: CHEST - 2 VIEW COMPARISON:  None Available. FINDINGS: The heart size and mediastinal contours are within normal limits. Both lungs are clear. The visualized skeletal structures are unremarkable. IMPRESSION: No active cardiopulmonary disease. Electronically Signed   By: Kennith Center M.D.   On: 12/04/2023 06:24     Assessment and Plan:   SCAD of ostial LAD Presenting with acute onset of chest pain while walking up stairs, no significant risk factors.  Left heart cath showed 95%  stenosis of ostial LAD with scad. EKG with subtle ST elevation inferolaterally, troponins in the 300s.  Plan now is for CABG with LIMA to LAD to be performed tomorrow 12/05/2023 Obtaining echocardiogram EF was estimated to be 35 to 45% Check TSH, A1c, LP(a), lipid panel. Will start aspirin 81 mg.  Defer further workup and management to CT surgery.     Risk Assessment/Risk Scores:    TIMI Risk Score for Unstable Angina or Non-ST Elevation MI:   The patient's TIMI risk score is 2, which indicates a 8% risk of all cause mortality, new or recurrent myocardial infarction or need for urgent revascularization in the next 14 days.{   Code Status: Full Code  Severity of Illness: The appropriate patient status for this patient is INPATIENT. Inpatient status is judged to be reasonable and necessary in order to provide the required intensity of service to ensure the patient's safety. The patient's presenting symptoms, physical exam findings, and initial radiographic and laboratory data in the context of their chronic comorbidities is felt to place them at high risk for further clinical deterioration. Furthermore, it is not anticipated that the patient will be medically stable for discharge from the hospital within 2 midnights  of admission.   * I certify that at the point of admission it is my clinical judgment that the patient will require inpatient hospital care spanning beyond 2 midnights from the point of admission due to high intensity of service, high risk for further deterioration and high frequency of surveillance required.*   For questions or updates, please contact  [facility] Please consult [email or web address] for contact info under     Signed, Abagail Kitchens, PA-C  12/04/2023 6:19 PM  Agree with note by Yvonna Alanis PA-C   47 y/o MWF w/o prior H/) CAD or CRF. Developed CP this AM and was eval at [facility] ER. Trop mod elevated---> LHC showed pro ostial/Prox LAD SCAD with mod LV dysfunction (EF 35-40%) with antero apical HK. Transferred to [facility] ICU. Currently pain free. Not on IV hep. BP soft. Exam benign. Seen by Fatima Blank for CABG (LIMA--->LAD) in AM. Will Rx with ASA. No IV hep. 2D echo revealed NL LV FXN. Agree with NOT performing PCI given angiographic appearance of SCAD and potential prox/distal propagation of dissection with PCI. If pt becomes unstable overnight may require more urgent revasc./ CABG.    Runell Gess, M.D., FACP, [facility], [facility] [address] [place], [place]  [address]  [phone] 12/04/2023 8:56 PM

## 2023-12-05 ENCOUNTER — Encounter (HOSPITAL_COMMUNITY): Payer: Self-pay | Admitting: Cardiology

## 2023-12-05 ENCOUNTER — Inpatient Hospital Stay (HOSPITAL_COMMUNITY): Payer: Self-pay | Admitting: Critical Care Medicine

## 2023-12-05 ENCOUNTER — Inpatient Hospital Stay (HOSPITAL_COMMUNITY)
Admission: EM | Disposition: A | Discharge status: Home / Self Care | Payer: Self-pay | Source: Other Acute Inpatient Hospital | Attending: Cardiovascular Disease

## 2023-12-05 ENCOUNTER — Inpatient Hospital Stay (HOSPITAL_COMMUNITY): Payer: Commercial Managed Care - PPO

## 2023-12-05 ENCOUNTER — Inpatient Hospital Stay (HOSPITAL_COMMUNITY)
Admission: RE | Admit: 2023-12-05 | Payer: Commercial Managed Care - PPO | Source: Home / Self Care | Admitting: Thoracic Surgery (Cardiothoracic Vascular Surgery)

## 2023-12-05 ENCOUNTER — Other Ambulatory Visit: Payer: Self-pay

## 2023-12-05 DIAGNOSIS — I214 Non-ST elevation (NSTEMI) myocardial infarction: Secondary | ICD-10-CM | POA: Diagnosis not present

## 2023-12-05 DIAGNOSIS — Z951 Presence of aortocoronary bypass graft: Secondary | ICD-10-CM

## 2023-12-05 DIAGNOSIS — I2510 Atherosclerotic heart disease of native coronary artery without angina pectoris: Secondary | ICD-10-CM | POA: Diagnosis not present

## 2023-12-05 DIAGNOSIS — I2542 Coronary artery dissection: Secondary | ICD-10-CM

## 2023-12-05 HISTORY — PX: CORONARY ARTERY BYPASS GRAFT: SHX141

## 2023-12-05 HISTORY — PX: TEE WITHOUT CARDIOVERSION: SHX5443

## 2023-12-05 HISTORY — DX: Presence of aortocoronary bypass graft: Z95.1

## 2023-12-05 LAB — URINALYSIS, ROUTINE W REFLEX MICROSCOPIC
Bilirubin Urine: NEGATIVE
Glucose, UA: NEGATIVE mg/dL
Hgb urine dipstick: NEGATIVE
Ketones, ur: 5 mg/dL — AB
Leukocytes,Ua: NEGATIVE
Nitrite: NEGATIVE
Protein, ur: NEGATIVE mg/dL
Specific Gravity, Urine: 1.025 (ref 1.005–1.030)
pH: 5 (ref 5.0–8.0)

## 2023-12-05 LAB — POCT I-STAT 7, (LYTES, BLD GAS, ICA,H+H)
Acid-base deficit: 2 mmol/L (ref 0.0–2.0)
Acid-base deficit: 2 mmol/L (ref 0.0–2.0)
Acid-base deficit: 2 mmol/L (ref 0.0–2.0)
Acid-base deficit: 6 mmol/L — ABNORMAL HIGH (ref 0.0–2.0)
Acid-base deficit: 5 mmol/L — ABNORMAL HIGH (ref 0.0–2.0)
Acid-base deficit: 5 mmol/L — ABNORMAL HIGH (ref 0.0–2.0)
Acid-base deficit: 5 mmol/L — ABNORMAL HIGH (ref 0.0–2.0)
Bicarbonate: 21.6 mmol/L (ref 20.0–28.0)
Bicarbonate: 23.2 mmol/L (ref 20.0–28.0)
Bicarbonate: 19.1 mmol/L — ABNORMAL LOW (ref 20.0–28.0)
Bicarbonate: 22.5 mmol/L (ref 20.0–28.0)
Bicarbonate: 20.4 mmol/L (ref 20.0–28.0)
Bicarbonate: 21.4 mmol/L (ref 20.0–28.0)
Bicarbonate: 22.1 mmol/L (ref 20.0–28.0)
Calcium, Ion: 1.16 mmol/L (ref 1.15–1.40)
Calcium, Ion: 1.26 mmol/L (ref 1.15–1.40)
Calcium, Ion: 0.96 mmol/L — ABNORMAL LOW (ref 1.15–1.40)
Calcium, Ion: 1.02 mmol/L — ABNORMAL LOW (ref 1.15–1.40)
Calcium, Ion: 1.07 mmol/L — ABNORMAL LOW (ref 1.15–1.40)
Calcium, Ion: 1.15 mmol/L (ref 1.15–1.40)
Calcium, Ion: 1.15 mmol/L (ref 1.15–1.40)
HCT: 44 % (ref 36.0–46.0)
HCT: 44 % (ref 36.0–46.0)
HCT: 29 % — ABNORMAL LOW (ref 36.0–46.0)
HCT: 31 % — ABNORMAL LOW (ref 36.0–46.0)
HCT: 35 % — ABNORMAL LOW (ref 36.0–46.0)
HCT: 31 % — ABNORMAL LOW (ref 36.0–46.0)
HCT: 31 % — ABNORMAL LOW (ref 36.0–46.0)
Hemoglobin: 15 g/dL (ref 12.0–15.0)
Hemoglobin: 15 g/dL (ref 12.0–15.0)
Hemoglobin: 10.5 g/dL — ABNORMAL LOW (ref 12.0–15.0)
Hemoglobin: 9.9 g/dL — ABNORMAL LOW (ref 12.0–15.0)
Hemoglobin: 11.9 g/dL — ABNORMAL LOW (ref 12.0–15.0)
Hemoglobin: 10.5 g/dL — ABNORMAL LOW (ref 12.0–15.0)
Hemoglobin: 10.5 g/dL — ABNORMAL LOW (ref 12.0–15.0)
O2 Saturation: 99 %
O2 Saturation: 100 %
O2 Saturation: 100 %
O2 Saturation: 100 %
O2 Saturation: 100 %
O2 Saturation: 99 %
O2 Saturation: 98 %
Patient temperature: 98.2
Patient temperature: 35.5
Patient temperature: 36.7
Patient temperature: 36.7
Potassium: 3.6 mmol/L (ref 3.5–5.1)
Potassium: 4 mmol/L (ref 3.5–5.1)
Potassium: 4 mmol/L (ref 3.5–5.1)
Potassium: 5.2 mmol/L — ABNORMAL HIGH (ref 3.5–5.1)
Potassium: 4.5 mmol/L (ref 3.5–5.1)
Potassium: 4.2 mmol/L (ref 3.5–5.1)
Potassium: 4.4 mmol/L (ref 3.5–5.1)
Sodium: 134 mmol/L — ABNORMAL LOW (ref 135–145)
Sodium: 140 mmol/L (ref 135–145)
Sodium: 135 mmol/L (ref 135–145)
Sodium: 138 mmol/L (ref 135–145)
Sodium: 138 mmol/L (ref 135–145)
Sodium: 140 mmol/L (ref 135–145)
Sodium: 140 mmol/L (ref 135–145)
TCO2: 23 mmol/L (ref 22–32)
TCO2: 24 mmol/L (ref 22–32)
TCO2: 20 mmol/L — ABNORMAL LOW (ref 22–32)
TCO2: 24 mmol/L (ref 22–32)
TCO2: 22 mmol/L (ref 22–32)
TCO2: 23 mmol/L (ref 22–32)
TCO2: 23 mmol/L (ref 22–32)
pCO2 arterial: 32.7 mm[Hg] (ref 32–48)
pCO2 arterial: 41.5 mm[Hg] (ref 32–48)
pCO2 arterial: 32.9 mm[Hg] (ref 32–48)
pCO2 arterial: 36.6 mm[Hg] (ref 32–48)
pCO2 arterial: 35.3 mm[Hg] (ref 32–48)
pCO2 arterial: 45.3 mm[Hg] (ref 32–48)
pCO2 arterial: 46.5 mm[Hg] (ref 32–48)
pH, Arterial: 7.427 (ref 7.35–7.45)
pH, Arterial: 7.355 (ref 7.35–7.45)
pH, Arterial: 7.371 (ref 7.35–7.45)
pH, Arterial: 7.396 (ref 7.35–7.45)
pH, Arterial: 7.363 (ref 7.35–7.45)
pH, Arterial: 7.28 — ABNORMAL LOW (ref 7.35–7.45)
pH, Arterial: 7.283 — ABNORMAL LOW (ref 7.35–7.45)
pO2, Arterial: 114 mm[Hg] — ABNORMAL HIGH (ref 83–108)
pO2, Arterial: 417 mm[Hg] — ABNORMAL HIGH (ref 83–108)
pO2, Arterial: 355 mm[Hg] — ABNORMAL HIGH (ref 83–108)
pO2, Arterial: 363 mm[Hg] — ABNORMAL HIGH (ref 83–108)
pO2, Arterial: 196 mm[Hg] — ABNORMAL HIGH (ref 83–108)
pO2, Arterial: 148 mm[Hg] — ABNORMAL HIGH (ref 83–108)
pO2, Arterial: 120 mm[Hg] — ABNORMAL HIGH (ref 83–108)

## 2023-12-05 LAB — POCT I-STAT, CHEM 8
BUN: 11 mg/dL (ref 6–20)
BUN: 10 mg/dL (ref 6–20)
BUN: 9 mg/dL (ref 6–20)
BUN: 9 mg/dL (ref 6–20)
Calcium, Ion: 1.28 mmol/L (ref 1.15–1.40)
Calcium, Ion: 1.23 mmol/L (ref 1.15–1.40)
Calcium, Ion: 1.01 mmol/L — ABNORMAL LOW (ref 1.15–1.40)
Calcium, Ion: 1.04 mmol/L — ABNORMAL LOW (ref 1.15–1.40)
Chloride: 103 mmol/L (ref 98–111)
Chloride: 103 mmol/L (ref 98–111)
Chloride: 101 mmol/L (ref 98–111)
Chloride: 103 mmol/L (ref 98–111)
Creatinine, Ser: 0.7 mg/dL (ref 0.44–1.00)
Creatinine, Ser: 0.8 mg/dL (ref 0.44–1.00)
Creatinine, Ser: 0.7 mg/dL (ref 0.44–1.00)
Creatinine, Ser: 0.7 mg/dL (ref 0.44–1.00)
Glucose, Bld: 105 mg/dL — ABNORMAL HIGH (ref 70–99)
Glucose, Bld: 119 mg/dL — ABNORMAL HIGH (ref 70–99)
Glucose, Bld: 119 mg/dL — ABNORMAL HIGH (ref 70–99)
Glucose, Bld: 182 mg/dL — ABNORMAL HIGH (ref 70–99)
HCT: 45 % (ref 36.0–46.0)
HCT: 41 % (ref 36.0–46.0)
HCT: 28 % — ABNORMAL LOW (ref 36.0–46.0)
HCT: 32 % — ABNORMAL LOW (ref 36.0–46.0)
Hemoglobin: 15.3 g/dL — ABNORMAL HIGH (ref 12.0–15.0)
Hemoglobin: 13.9 g/dL (ref 12.0–15.0)
Hemoglobin: 10.9 g/dL — ABNORMAL LOW (ref 12.0–15.0)
Hemoglobin: 9.5 g/dL — ABNORMAL LOW (ref 12.0–15.0)
Potassium: 4 mmol/L (ref 3.5–5.1)
Potassium: 3.9 mmol/L (ref 3.5–5.1)
Potassium: 5.2 mmol/L — ABNORMAL HIGH (ref 3.5–5.1)
Potassium: 5.7 mmol/L — ABNORMAL HIGH (ref 3.5–5.1)
Sodium: 141 mmol/L (ref 135–145)
Sodium: 138 mmol/L (ref 135–145)
Sodium: 136 mmol/L (ref 135–145)
Sodium: 138 mmol/L (ref 135–145)
TCO2: 27 mmol/L (ref 22–32)
TCO2: 24 mmol/L (ref 22–32)
TCO2: 23 mmol/L (ref 22–32)
TCO2: 25 mmol/L (ref 22–32)

## 2023-12-05 LAB — BASIC METABOLIC PANEL
Anion gap: 11 (ref 5–15)
Anion gap: 7 (ref 5–15)
BUN: 11 mg/dL (ref 6–20)
BUN: 8 mg/dL (ref 6–20)
CO2: 21 mmol/L — ABNORMAL LOW (ref 22–32)
CO2: 22 mmol/L (ref 22–32)
Calcium: 9.3 mg/dL (ref 8.9–10.3)
Calcium: 7.8 mg/dL — ABNORMAL LOW (ref 8.9–10.3)
Chloride: 106 mmol/L (ref 98–111)
Chloride: 109 mmol/L (ref 98–111)
Creatinine, Ser: 0.77 mg/dL (ref 0.44–1.00)
Creatinine, Ser: 0.74 mg/dL (ref 0.44–1.00)
GFR, Estimated: >60 mL/min (ref >60)
GFR, Estimated: >60 mL/min (ref >60)
Glucose, Bld: 98 mg/dL (ref 70–99)
Glucose, Bld: 149 mg/dL — ABNORMAL HIGH (ref 70–99)
Potassium: 3.5 mmol/L (ref 3.5–5.1)
Potassium: 4.3 mmol/L (ref 3.5–5.1)
Sodium: 138 mmol/L (ref 135–145)
Sodium: 138 mmol/L (ref 135–145)

## 2023-12-05 LAB — POCT I-STAT EG7
Acid-base deficit: 3 mmol/L — ABNORMAL HIGH (ref 0.0–2.0)
Bicarbonate: 22.2 mmol/L (ref 20.0–28.0)
Calcium, Ion: 1.03 mmol/L — ABNORMAL LOW (ref 1.15–1.40)
HCT: 31 % — ABNORMAL LOW (ref 36.0–46.0)
Hemoglobin: 10.5 g/dL — ABNORMAL LOW (ref 12.0–15.0)
O2 Saturation: 76 %
Potassium: 5.2 mmol/L — ABNORMAL HIGH (ref 3.5–5.1)
Sodium: 138 mmol/L (ref 135–145)
TCO2: 23 mmol/L (ref 22–32)
pCO2, Ven: 38.6 mm[Hg] — ABNORMAL LOW (ref 44–60)
pH, Ven: 7.368 (ref 7.25–7.43)
pO2, Ven: 42 mm[Hg] (ref 32–45)

## 2023-12-05 LAB — CBC
HCT: 45 % (ref 36.0–46.0)
HCT: 35.3 % — ABNORMAL LOW (ref 36.0–46.0)
HCT: 32.4 % — ABNORMAL LOW (ref 36.0–46.0)
Hemoglobin: 15.7 g/dL — ABNORMAL HIGH (ref 12.0–15.0)
Hemoglobin: 12 g/dL (ref 12.0–15.0)
Hemoglobin: 10.8 g/dL — ABNORMAL LOW (ref 12.0–15.0)
MCH: 31.8 pg (ref 26.0–34.0)
MCH: 31.5 pg (ref 26.0–34.0)
MCH: 31.8 pg (ref 26.0–34.0)
MCHC: 34.9 g/dL (ref 30.0–36.0)
MCHC: 34 g/dL (ref 30.0–36.0)
MCHC: 33.3 g/dL (ref 30.0–36.0)
MCV: 91.1 fL (ref 80.0–100.0)
MCV: 92.7 fL (ref 80.0–100.0)
MCV: 95.3 fL (ref 80.0–100.0)
Platelets: 405 10*3/uL — ABNORMAL HIGH (ref 150–400)
Platelets: 457 10*3/uL — ABNORMAL HIGH (ref 150–400)
Platelets: 608 10*3/uL — ABNORMAL HIGH (ref 150–400)
RBC: 4.94 MIL/uL (ref 3.87–5.11)
RBC: 3.81 MIL/uL — ABNORMAL LOW (ref 3.87–5.11)
RBC: 3.4 MIL/uL — ABNORMAL LOW (ref 3.87–5.11)
RDW: 13.4 % (ref 11.5–15.5)
RDW: 13.4 % (ref 11.5–15.5)
RDW: 13.8 % (ref 11.5–15.5)
WBC: 7.7 10*3/uL (ref 4.0–10.5)
WBC: 21.3 10*3/uL — ABNORMAL HIGH (ref 4.0–10.5)
WBC: 21.7 10*3/uL — ABNORMAL HIGH (ref 4.0–10.5)
nRBC: 0 % (ref 0.0–0.2)
nRBC: 0 % (ref 0.0–0.2)
nRBC: 0 % (ref 0.0–0.2)

## 2023-12-05 LAB — ECHO INTRAOPERATIVE TEE
Height: 64 in
Weight: 2123.47 [oz_av]

## 2023-12-05 LAB — HEMOGLOBIN AND HEMATOCRIT, BLOOD
HCT: 29.9 % — ABNORMAL LOW (ref 36.0–46.0)
Hemoglobin: 10.2 g/dL — ABNORMAL LOW (ref 12.0–15.0)

## 2023-12-05 LAB — GLUCOSE, CAPILLARY
Glucose-Capillary: 142 mg/dL — ABNORMAL HIGH (ref 70–99)
Glucose-Capillary: 134 mg/dL — ABNORMAL HIGH (ref 70–99)
Glucose-Capillary: 155 mg/dL — ABNORMAL HIGH (ref 70–99)
Glucose-Capillary: 139 mg/dL — ABNORMAL HIGH (ref 70–99)
Glucose-Capillary: 153 mg/dL — ABNORMAL HIGH (ref 70–99)
Glucose-Capillary: 142 mg/dL — ABNORMAL HIGH (ref 70–99)
Glucose-Capillary: 143 mg/dL — ABNORMAL HIGH (ref 70–99)
Glucose-Capillary: 171 mg/dL — ABNORMAL HIGH (ref 70–99)

## 2023-12-05 LAB — PROTIME-INR
INR: 1.7 — ABNORMAL HIGH (ref 0.8–1.2)
Prothrombin Time: 19.8 s — ABNORMAL HIGH (ref 11.4–15.2)

## 2023-12-05 LAB — MRSA NEXT GEN BY PCR, NASAL: MRSA by PCR Next Gen: NOT DETECTED

## 2023-12-05 LAB — LIPID PANEL
Cholesterol: 152 mg/dL (ref 0–200)
HDL: 52 mg/dL (ref >40)
LDL Cholesterol: 90 mg/dL (ref 0–99)
Total CHOL/HDL Ratio: 2.9 {ratio}
Triglycerides: 50 mg/dL (ref <150)
VLDL: 10 mg/dL (ref 0–40)

## 2023-12-05 LAB — ABO/RH: ABO/RH(D): A POS

## 2023-12-05 LAB — HEMOGLOBIN A1C
Hgb A1c MFr Bld: 5.3 % (ref 4.8–5.6)
Mean Plasma Glucose: 105.41 mg/dL

## 2023-12-05 LAB — MAGNESIUM: Magnesium: 3.5 mg/dL — ABNORMAL HIGH (ref 1.7–2.4)

## 2023-12-05 LAB — APTT: aPTT: 43 s — ABNORMAL HIGH (ref 24–36)

## 2023-12-05 LAB — PLATELET COUNT: Platelets: 550 10*3/uL — ABNORMAL HIGH (ref 150–400)

## 2023-12-05 SURGERY — CORONARY ARTERY BYPASS GRAFTING (CABG)
Anesthesia: General | Site: Esophagus

## 2023-12-05 MED ORDER — ROCURONIUM BROMIDE 10 MG/ML (PF) SYRINGE
PREFILLED_SYRINGE | INTRAVENOUS | Status: DC | PRN
  Administered 2023-12-05 (×2): 100 mg via INTRAVENOUS
  Administered 2023-12-05: 50 mg via INTRAVENOUS

## 2023-12-05 MED ORDER — LACTATED RINGERS IV SOLN: INTRAVENOUS | Status: DC | PRN

## 2023-12-05 MED ORDER — DEXAMETHASONE SODIUM PHOSPHATE 10 MG/ML IJ SOLN
INTRAMUSCULAR | Status: DC | PRN
  Administered 2023-12-05: .7 mg via INTRAVENOUS

## 2023-12-05 MED ORDER — PHENYLEPHRINE HCL-NACL 20-0.9 MG/250ML-% IV SOLN
0.0000 ug/min | INTRAVENOUS | Status: DC
Start: 2023-12-05 — End: 2023-12-08
  Administered 2023-12-05: 60 ug/min via INTRAVENOUS
  Administered 2023-12-06: 20 ug/min via INTRAVENOUS
  Filled 2023-12-05 (×2): qty 250

## 2023-12-05 MED ORDER — MIDAZOLAM HCL (PF) 5 MG/ML IJ SOLN
INTRAMUSCULAR | Status: DC | PRN
  Administered 2023-12-05: 1 mg via INTRAVENOUS
  Administered 2023-12-05 (×4): 2 mg via INTRAVENOUS
  Administered 2023-12-05: 1 mg via INTRAVENOUS

## 2023-12-05 MED ORDER — VANCOMYCIN HCL 1000 MG IV SOLR
INTRAVENOUS | Status: DC | PRN
  Administered 2023-12-05: 1000 mL

## 2023-12-05 MED ORDER — LACTATED RINGERS IV SOLN: INTRAVENOUS | Status: DC

## 2023-12-05 MED ORDER — FENTANYL CITRATE (PF) 250 MCG/5ML IJ SOLN
INTRAMUSCULAR | Status: AC
  Filled 2023-12-05: qty 5

## 2023-12-05 MED ORDER — PROTAMINE SULFATE 10 MG/ML IV SOLN
INTRAVENOUS | Status: DC | PRN
  Administered 2023-12-05: 220 mg via INTRAVENOUS

## 2023-12-05 MED ORDER — HEPARIN SODIUM (PORCINE) 1000 UNIT/ML IJ SOLN
INTRAMUSCULAR | Status: DC | PRN
  Administered 2023-12-05: 22000 [IU] via INTRAVENOUS
  Administered 2023-12-05: 8000 [IU] via INTRAVENOUS

## 2023-12-05 MED ORDER — ASPIRIN 81 MG PO CHEW: 324.0000 mg | CHEWABLE_TABLET | Freq: Every day | ORAL | Status: DC

## 2023-12-05 MED ORDER — SODIUM CHLORIDE 0.9% FLUSH
3.0000 mL | Freq: Two times a day (BID) | INTRAVENOUS | Status: DC
  Administered 2023-12-06 – 2023-12-08 (×6): 3 mL via INTRAVENOUS

## 2023-12-05 MED ORDER — PHENYLEPHRINE 80 MCG/ML (10ML) SYRINGE FOR IV PUSH (FOR BLOOD PRESSURE SUPPORT)
PREFILLED_SYRINGE | INTRAVENOUS | Status: AC
  Filled 2023-12-05: qty 10

## 2023-12-05 MED ORDER — FENTANYL CITRATE (PF) 250 MCG/5ML IJ SOLN
INTRAMUSCULAR | Status: DC | PRN
  Administered 2023-12-05 (×3): 50 ug via INTRAVENOUS
  Administered 2023-12-05: 100 ug via INTRAVENOUS

## 2023-12-05 MED ORDER — ALBUMIN HUMAN 5 % IV SOLN: INTRAVENOUS | Status: DC | PRN

## 2023-12-05 MED ORDER — SODIUM CHLORIDE 0.9% FLUSH
10.0000 mL | Freq: Two times a day (BID) | INTRAVENOUS | Status: DC
Start: 2023-12-05 — End: 2023-12-09
  Administered 2023-12-05 – 2023-12-08 (×6): 10 mL via INTRAVENOUS

## 2023-12-05 MED ORDER — NOREPINEPHRINE 4 MG/250ML-% IV SOLN
0.0000 ug/min | INTRAVENOUS | Status: DC
  Filled 2023-12-05: qty 250

## 2023-12-05 MED ORDER — NITROGLYCERIN IN D5W 200-5 MCG/ML-% IV SOLN
2.0000 ug/min | INTRAVENOUS | Status: DC
  Filled 2023-12-05: qty 250

## 2023-12-05 MED ORDER — PROPOFOL 10 MG/ML IV BOLUS
INTRAVENOUS | Status: AC
  Filled 2023-12-05: qty 20

## 2023-12-05 MED ORDER — OXYCODONE HCL 5 MG PO TABS
5.0000 mg | ORAL_TABLET | ORAL | Status: DC | PRN
  Administered 2023-12-05: 5 mg via ORAL
  Filled 2023-12-05: qty 1

## 2023-12-05 MED ORDER — PAPAVERINE HCL 30 MG/ML IJ SOLN
INTRAMUSCULAR | Status: AC
  Filled 2023-12-05: qty 4

## 2023-12-05 MED ORDER — SODIUM CHLORIDE 0.9 % IV SOLN: INTRAVENOUS | Status: DC

## 2023-12-05 MED ORDER — KETOROLAC TROMETHAMINE 30 MG/ML IJ SOLN
INTRAMUSCULAR | Status: AC
  Filled 2023-12-05: qty 1

## 2023-12-05 MED ORDER — ONDANSETRON HCL 4 MG/2ML IJ SOLN
4.0000 mg | Freq: Four times a day (QID) | INTRAMUSCULAR | Status: DC | PRN
Start: 2023-12-05 — End: 2023-12-09
  Administered 2023-12-05 – 2023-12-06 (×2): 4 mg via INTRAVENOUS
  Filled 2023-12-05 (×3): qty 2

## 2023-12-05 MED ORDER — EPINEPHRINE HCL 5 MG/250ML IV SOLN IN NS
0.0000 ug/min | INTRAVENOUS | Status: DC
  Filled 2023-12-05: qty 250

## 2023-12-05 MED ORDER — CEFAZOLIN SODIUM-DEXTROSE 2-4 GM/100ML-% IV SOLN
2.0000 g | Freq: Three times a day (TID) | INTRAVENOUS | Status: AC
  Administered 2023-12-05 – 2023-12-07 (×6): 2 g via INTRAVENOUS
  Filled 2023-12-05 (×6): qty 100

## 2023-12-05 MED ORDER — BISACODYL 10 MG RE SUPP: 10.0000 mg | Freq: Every day | RECTAL | Status: DC

## 2023-12-05 MED ORDER — SODIUM CHLORIDE 0.45 % IV SOLN: INTRAVENOUS | Status: DC | PRN

## 2023-12-05 MED ORDER — PROTAMINE SULFATE 10 MG/ML IV SOLN
INTRAVENOUS | Status: AC
  Filled 2023-12-05: qty 25

## 2023-12-05 MED ORDER — ACETAMINOPHEN 160 MG/5ML PO SOLN: 650.0000 mg | Freq: Once | ORAL | Status: DC

## 2023-12-05 MED ORDER — MAGNESIUM SULFATE 50 % IJ SOLN
40.0000 meq | INTRAMUSCULAR | Status: DC
  Filled 2023-12-05: qty 9.85

## 2023-12-05 MED ORDER — PLASMA-LYTE A IV SOLN
INTRAVENOUS | Status: DC
  Filled 2023-12-05: qty 2.5

## 2023-12-05 MED ORDER — PROPOFOL 10 MG/ML IV BOLUS
INTRAVENOUS | Status: DC | PRN
  Administered 2023-12-05: 60 mg via INTRAVENOUS

## 2023-12-05 MED ORDER — VANCOMYCIN HCL 1250 MG/250ML IV SOLN
1250.0000 mg | INTRAVENOUS | Status: AC
  Administered 2023-12-05: 1250 mg via INTRAVENOUS
  Filled 2023-12-05: qty 250

## 2023-12-05 MED ORDER — INSULIN REGULAR(HUMAN) IN NACL 100-0.9 UT/100ML-% IV SOLN
INTRAVENOUS | Status: DC
  Administered 2023-12-05: 1 [IU]/h via INTRAVENOUS

## 2023-12-05 MED ORDER — METOCLOPRAMIDE HCL 5 MG/ML IJ SOLN
10.0000 mg | Freq: Four times a day (QID) | INTRAMUSCULAR | Status: AC
  Administered 2023-12-05 – 2023-12-06 (×6): 10 mg via INTRAVENOUS
  Filled 2023-12-05 (×6): qty 2

## 2023-12-05 MED ORDER — ROCURONIUM BROMIDE 10 MG/ML (PF) SYRINGE
PREFILLED_SYRINGE | INTRAVENOUS | Status: AC
  Filled 2023-12-05: qty 10

## 2023-12-05 MED ORDER — METHADONE HCL IV SYRINGE 10 MG/ML FOR CABG
0.3000 mg/kg | Freq: Once | INTRAMUSCULAR | Status: AC
  Administered 2023-12-05: 16 mg via INTRAVENOUS
  Filled 2023-12-05: qty 1.6

## 2023-12-05 MED ORDER — ASPIRIN 325 MG PO TBEC
325.0000 mg | DELAYED_RELEASE_TABLET | Freq: Every day | ORAL | Status: DC
  Administered 2023-12-06 – 2023-12-09 (×4): 325 mg via ORAL
  Filled 2023-12-05 (×4): qty 1

## 2023-12-05 MED ORDER — METOPROLOL TARTRATE 12.5 MG HALF TABLET
12.5000 mg | ORAL_TABLET | Freq: Two times a day (BID) | ORAL | Status: DC
  Administered 2023-12-07 – 2023-12-09 (×5): 12.5 mg via ORAL
  Filled 2023-12-05 (×7): qty 1

## 2023-12-05 MED ORDER — ACETAMINOPHEN 160 MG/5ML PO SOLN
650.0000 mg | Freq: Once | ORAL | Status: DC
Start: 2023-12-05 — End: 2023-12-05

## 2023-12-05 MED ORDER — MIDAZOLAM HCL 2 MG/2ML IJ SOLN
2.0000 mg | INTRAMUSCULAR | Status: DC | PRN
Start: 2023-12-05 — End: 2023-12-08

## 2023-12-05 MED ORDER — VANCOMYCIN HCL IN DEXTROSE 1-5 GM/200ML-% IV SOLN
1000.0000 mg | Freq: Once | INTRAVENOUS | Status: AC
Start: 2023-12-05 — End: 2023-12-06
  Administered 2023-12-05: 1000 mg via INTRAVENOUS
  Filled 2023-12-05: qty 200

## 2023-12-05 MED ORDER — MILRINONE LACTATE IN DEXTROSE 20-5 MG/100ML-% IV SOLN
0.3000 ug/kg/min | INTRAVENOUS | Status: DC
  Filled 2023-12-05: qty 100

## 2023-12-05 MED ORDER — TRANEXAMIC ACID (OHS) BOLUS VIA INFUSION
15.0000 mg/kg | INTRAVENOUS | Status: AC
  Administered 2023-12-05: 915 mg via INTRAVENOUS
  Filled 2023-12-05: qty 915

## 2023-12-05 MED ORDER — POTASSIUM CHLORIDE 10 MEQ/50ML IV SOLN: 10.0000 meq | INTRAVENOUS | Status: AC

## 2023-12-05 MED ORDER — PAPAVERINE HCL 30 MG/ML IJ SOLN
INTRAMUSCULAR | Status: DC | PRN
  Administered 2023-12-05: 60 mg via INTRAVENOUS

## 2023-12-05 MED ORDER — ALBUMIN HUMAN 5 % IV SOLN
250.0000 mL | INTRAVENOUS | Status: DC | PRN
  Administered 2023-12-05 (×3): 12.5 g via INTRAVENOUS
  Filled 2023-12-05: qty 250

## 2023-12-05 MED ORDER — SODIUM CHLORIDE 0.9 % IV SOLN: INTRAVENOUS | Status: DC | PRN

## 2023-12-05 MED ORDER — KETOROLAC TROMETHAMINE 15 MG/ML IJ SOLN
15.0000 mg | Freq: Four times a day (QID) | INTRAMUSCULAR | Status: AC | PRN
Start: 2023-12-05 — End: 2023-12-07
  Administered 2023-12-05 – 2023-12-07 (×3): 15 mg via INTRAVENOUS
  Filled 2023-12-05 (×3): qty 1

## 2023-12-05 MED ORDER — TRANEXAMIC ACID 1000 MG/10ML IV SOLN
1.5000 mg/kg/h | INTRAVENOUS | Status: AC
  Administered 2023-12-05: 1.5 mg/kg/h via INTRAVENOUS
  Filled 2023-12-05: qty 25

## 2023-12-05 MED ORDER — DOCUSATE SODIUM 100 MG PO CAPS
200.0000 mg | ORAL_CAPSULE | Freq: Every day | ORAL | Status: DC
  Administered 2023-12-06 – 2023-12-07 (×2): 200 mg via ORAL
  Filled 2023-12-05 (×2): qty 2

## 2023-12-05 MED ORDER — ASPIRIN 81 MG PO CHEW
324.0000 mg | CHEWABLE_TABLET | Freq: Once | ORAL | Status: AC
  Administered 2023-12-05: 324 mg via ORAL
  Filled 2023-12-05: qty 4

## 2023-12-05 MED ORDER — DEXMEDETOMIDINE HCL IN NACL 400 MCG/100ML IV SOLN
0.0000 ug/kg/h | INTRAVENOUS | Status: DC
Start: 2023-12-05 — End: 2023-12-06

## 2023-12-05 MED ORDER — POTASSIUM CHLORIDE 2 MEQ/ML IV SOLN
80.0000 meq | INTRAVENOUS | Status: DC
  Filled 2023-12-05: qty 40

## 2023-12-05 MED ORDER — PHENYLEPHRINE 80 MCG/ML (10ML) SYRINGE FOR IV PUSH (FOR BLOOD PRESSURE SUPPORT)
PREFILLED_SYRINGE | INTRAVENOUS | Status: DC | PRN
  Administered 2023-12-05: 40 ug via INTRAVENOUS
  Administered 2023-12-05: 160 ug via INTRAVENOUS
  Administered 2023-12-05: 80 ug via INTRAVENOUS
  Administered 2023-12-05 (×2): 40 ug via INTRAVENOUS
  Administered 2023-12-05: 160 ug via INTRAVENOUS
  Administered 2023-12-05: 80 ug via INTRAVENOUS
  Administered 2023-12-05: 160 ug via INTRAVENOUS
  Administered 2023-12-05: 80 ug via INTRAVENOUS

## 2023-12-05 MED ORDER — PANTOPRAZOLE SODIUM 40 MG PO TBEC
40.0000 mg | DELAYED_RELEASE_TABLET | Freq: Every day | ORAL | Status: DC
  Administered 2023-12-07 – 2023-12-09 (×3): 40 mg via ORAL
  Filled 2023-12-05 (×3): qty 1

## 2023-12-05 MED ORDER — METOPROLOL TARTRATE 5 MG/5ML IV SOLN: 2.5000 mg | INTRAVENOUS | Status: DC | PRN

## 2023-12-05 MED ORDER — ACETAMINOPHEN 325 MG PO TABS
650.0000 mg | ORAL_TABLET | Freq: Once | ORAL | Status: AC
  Administered 2023-12-05: 650 mg via ORAL
  Filled 2023-12-05: qty 2

## 2023-12-05 MED ORDER — HEPARIN SODIUM (PORCINE) 1000 UNIT/ML IJ SOLN
INTRAMUSCULAR | Status: AC
  Filled 2023-12-05: qty 10

## 2023-12-05 MED ORDER — MIDAZOLAM HCL (PF) 10 MG/2ML IJ SOLN
INTRAMUSCULAR | Status: AC
  Filled 2023-12-05: qty 2

## 2023-12-05 MED ORDER — HEPARIN 30,000 UNITS/1000 ML (OHS) CELLSAVER SOLUTION
Status: DC
  Filled 2023-12-05: qty 1000

## 2023-12-05 MED ORDER — MAGNESIUM SULFATE 4 GM/100ML IV SOLN
4.0000 g | Freq: Once | INTRAVENOUS | Status: AC
Start: 2023-12-05 — End: 2023-12-05
  Administered 2023-12-05: 4 g via INTRAVENOUS
  Filled 2023-12-05: qty 100

## 2023-12-05 MED ORDER — BISACODYL 5 MG PO TBEC
10.0000 mg | DELAYED_RELEASE_TABLET | Freq: Every day | ORAL | Status: DC
  Administered 2023-12-06 – 2023-12-07 (×2): 10 mg via ORAL
  Filled 2023-12-05 (×2): qty 2

## 2023-12-05 MED ORDER — CEFAZOLIN SODIUM-DEXTROSE 2-4 GM/100ML-% IV SOLN
2.0000 g | INTRAVENOUS | Status: AC
  Administered 2023-12-05: 2 g via INTRAVENOUS

## 2023-12-05 MED ORDER — AMIODARONE HCL 200 MG PO TABS
400.0000 mg | ORAL_TABLET | Freq: Two times a day (BID) | ORAL | Status: DC
  Administered 2023-12-05 – 2023-12-06 (×3): 400 mg via ORAL
  Filled 2023-12-05 (×3): qty 2

## 2023-12-05 MED ORDER — MORPHINE SULFATE (PF) 2 MG/ML IV SOLN
1.0000 mg | INTRAVENOUS | Status: DC | PRN
  Administered 2023-12-05 (×2): 2 mg via INTRAVENOUS
  Filled 2023-12-05 (×4): qty 1

## 2023-12-05 MED ORDER — PHENYLEPHRINE HCL-NACL 20-0.9 MG/250ML-% IV SOLN
30.0000 ug/min | INTRAVENOUS | Status: AC
Start: 2023-12-05 — End: 2023-12-05
  Administered 2023-12-05: 30 ug/min via INTRAVENOUS
  Filled 2023-12-05: qty 250

## 2023-12-05 MED ORDER — DEXTROSE 50 % IV SOLN: 0.0000 mL | INTRAVENOUS | Status: DC | PRN

## 2023-12-05 MED ORDER — INSULIN REGULAR(HUMAN) IN NACL 100-0.9 UT/100ML-% IV SOLN: INTRAVENOUS | Status: AC

## 2023-12-05 MED ORDER — HEPARIN SODIUM (PORCINE) 1000 UNIT/ML IJ SOLN
INTRAMUSCULAR | Status: AC
  Filled 2023-12-05: qty 1

## 2023-12-05 MED ORDER — CHLORHEXIDINE GLUCONATE CLOTH 2 % EX PADS
6.0000 | MEDICATED_PAD | Freq: Every day | CUTANEOUS | Status: DC
  Administered 2023-12-05 – 2023-12-08 (×4): 6 via TOPICAL

## 2023-12-05 MED ORDER — 0.9 % SODIUM CHLORIDE (POUR BTL) OPTIME
TOPICAL | Status: DC | PRN
  Administered 2023-12-05: 5000 mL

## 2023-12-05 MED ORDER — ACETAMINOPHEN 160 MG/5ML PO SOLN: 1000.0000 mg | Freq: Four times a day (QID) | ORAL | Status: DC

## 2023-12-05 MED ORDER — PANTOPRAZOLE SODIUM 40 MG IV SOLR
40.0000 mg | Freq: Every day | INTRAVENOUS | Status: AC
  Administered 2023-12-05 – 2023-12-06 (×2): 40 mg via INTRAVENOUS
  Filled 2023-12-05 (×2): qty 10

## 2023-12-05 MED ORDER — SODIUM CHLORIDE 0.9% FLUSH: 3.0000 mL | INTRAVENOUS | Status: DC | PRN

## 2023-12-05 MED ORDER — TRAMADOL HCL 50 MG PO TABS
50.0000 mg | ORAL_TABLET | ORAL | Status: DC | PRN
  Administered 2023-12-06 – 2023-12-07 (×2): 50 mg via ORAL
  Administered 2023-12-08: 100 mg via ORAL
  Filled 2023-12-05 (×2): qty 1
  Filled 2023-12-05: qty 2

## 2023-12-05 MED ORDER — PLASMA-LYTE A IV SOLN
INTRAVENOUS | Status: DC | PRN
  Administered 2023-12-05: 500 mL

## 2023-12-05 MED ORDER — VANCOMYCIN HCL 1000 MG IV SOLR
INTRAVENOUS | Status: DC
  Filled 2023-12-05: qty 20

## 2023-12-05 MED ORDER — ACETAMINOPHEN 500 MG PO TABS
1000.0000 mg | ORAL_TABLET | Freq: Four times a day (QID) | ORAL | Status: DC
  Administered 2023-12-06 – 2023-12-09 (×12): 1000 mg via ORAL
  Filled 2023-12-05 (×12): qty 2

## 2023-12-05 MED ORDER — TRANEXAMIC ACID (OHS) PUMP PRIME SOLUTION
2.0000 mg/kg | INTRAVENOUS | Status: DC
  Filled 2023-12-05: qty 1.22

## 2023-12-05 MED ORDER — DEXMEDETOMIDINE HCL IN NACL 400 MCG/100ML IV SOLN
0.1000 ug/kg/h | INTRAVENOUS | Status: AC
  Administered 2023-12-05: .3 ug/kg/h via INTRAVENOUS
  Filled 2023-12-05: qty 100

## 2023-12-05 MED ORDER — METOPROLOL TARTRATE 25 MG/10 ML ORAL SUSPENSION: 12.5000 mg | Freq: Two times a day (BID) | ORAL | Status: DC

## 2023-12-05 MED ORDER — CHLORHEXIDINE GLUCONATE 0.12 % MT SOLN
15.0000 mL | OROMUCOSAL | Status: AC
  Administered 2023-12-05: 15 mL via OROMUCOSAL
  Filled 2023-12-05: qty 15

## 2023-12-05 MED ORDER — NITROGLYCERIN IN D5W 200-5 MCG/ML-% IV SOLN: 0.0000 ug/min | INTRAVENOUS | Status: DC

## 2023-12-05 MED ORDER — KETOROLAC TROMETHAMINE 30 MG/ML IJ SOLN
30.0000 mg | Freq: Once | INTRAMUSCULAR | Status: AC
  Administered 2023-12-05: 30 mg via INTRAVENOUS

## 2023-12-05 SURGICAL SUPPLY — 53 items
ADAPTER CARDIO PERF ANTE/RETRO (ADAPTER) ×2 IMPLANT
BAG DECANTER FOR FLEXI CONT (MISCELLANEOUS) ×2 IMPLANT
BLADE MICRO SHARP 3 15 DEG (BLADE) ×2 IMPLANT
BLADE STERNUM SYSTEM 6 (BLADE) ×2 IMPLANT
CANISTER SUCT 3000ML PPV (MISCELLANEOUS) ×2 IMPLANT
CANNULA MC2 2 STG 36/46 CONN (CANNULA) IMPLANT
CANNULA NON VENT 20FR 12 (CANNULA) ×2 IMPLANT
CATH RETROPLEGIA CORONARY 14FR (CATHETERS) ×2 IMPLANT
CATH ROBINSON RED A/P 18FR (CATHETERS) ×6 IMPLANT
CATH THOR STR 32F SOFT 20 RADI (CATHETERS) ×4 IMPLANT
CATH THORACIC 28FR RT ANG (CATHETERS) ×2 IMPLANT
CLAMP SUTURE YELLOW 5 PAIRS (MISCELLANEOUS) ×2 IMPLANT
CLIP TI LARGE 6 (CLIP) ×2 IMPLANT
CNTNR URN SCR LID CUP LEK RST (MISCELLANEOUS) ×4 IMPLANT
DRAPE CV SPLIT W-CLR ANES SCRN (DRAPES) ×2 IMPLANT
DRAPE INCISE IOBAN 66X45 STRL (DRAPES) ×2 IMPLANT
DRAPE PERI GROIN 82X75IN TIB (DRAPES) ×2 IMPLANT
DRSG AQUACEL AG ADV 3.5X10 (GAUZE/BANDAGES/DRESSINGS) ×2 IMPLANT
ELECT BLADE 4.0 EZ CLEAN MEGAD (MISCELLANEOUS) ×2
ELECT REM PT RETURN 9FT ADLT (ELECTROSURGICAL) ×4
ELECTRODE BLDE 4.0 EZ CLN MEGD (MISCELLANEOUS) ×2 IMPLANT
ELECTRODE REM PT RTRN 9FT ADLT (ELECTROSURGICAL) ×4 IMPLANT
FELT TEFLON 1X6 (MISCELLANEOUS) ×2 IMPLANT
GAUZE SPONGE 4X4 12PLY STRL (GAUZE/BANDAGES/DRESSINGS) ×4 IMPLANT
GOWN STRL REUS W/ TWL LRG LVL3 (GOWN DISPOSABLE) ×12 IMPLANT
INSERT FOGARTY 61MM (MISCELLANEOUS) IMPLANT
KIT BASIN OR (CUSTOM PROCEDURE TRAY) ×2 IMPLANT
KIT TURNOVER KIT B (KITS) ×2 IMPLANT
NS IRRIG 1000ML POUR BTL (IV SOLUTION) ×10 IMPLANT
PACK E OPEN HEART (SUTURE) ×2 IMPLANT
PACK OPEN HEART (CUSTOM PROCEDURE TRAY) ×2 IMPLANT
PAD ARMBOARD 7.5X6 YLW CONV (MISCELLANEOUS) ×4 IMPLANT
PAD ELECT DEFIB RADIOL ZOLL (MISCELLANEOUS) ×2 IMPLANT
PENCIL BUTTON HOLSTER BLD 10FT (ELECTRODE) ×2 IMPLANT
POSITIONER HEAD DONUT 9IN (MISCELLANEOUS) ×2 IMPLANT
SET MPS 3-ND DEL (MISCELLANEOUS) IMPLANT
SUPPORT HEART JANKE-BARRON (MISCELLANEOUS) ×2 IMPLANT
SUT MNCRL AB 4-0 PS2 18 (SUTURE) ×4 IMPLANT
SUT PROLENE 4 0 SH DA (SUTURE) ×2 IMPLANT
SUT PROLENE 4-0 RB1 .5 CRCL 36 (SUTURE) ×2 IMPLANT
SUT PROLENE 7 0 BV1 MDA (SUTURE) ×2 IMPLANT
SUT STEEL SZ 6 DBL 3X14 BALL (SUTURE) ×4 IMPLANT
SUT VIC AB 0 CTX36XBRD ANTBCTR (SUTURE) ×4 IMPLANT
SUT VIC AB 2-0 CT1 TAPERPNT 27 (SUTURE) ×4 IMPLANT
SYSTEM SAHARA CHEST DRAIN ATS (WOUND CARE) ×2 IMPLANT
TAG SUTURE CLAMP YLW 5PR (MISCELLANEOUS) ×2
TAPE CLOTH 3X10 TAN LF (GAUZE/BANDAGES/DRESSINGS) IMPLANT
TAPE PAPER 2X10 WHT MICROPORE (GAUZE/BANDAGES/DRESSINGS) IMPLANT
TOWEL GREEN STERILE (TOWEL DISPOSABLE) ×2 IMPLANT
TOWEL GREEN STERILE FF (TOWEL DISPOSABLE) ×2 IMPLANT
TRAY FOLEY SLVR 16FR TEMP STAT (SET/KITS/TRAYS/PACK) ×2 IMPLANT
UNDERPAD 30X36 HEAVY ABSORB (UNDERPADS AND DIAPERS) ×2 IMPLANT
WATER STERILE IRR 1000ML POUR (IV SOLUTION) ×4 IMPLANT

## 2023-12-05 NOTE — Op Note (Signed)
CARDIOVASCULAR SURGERY OPERATIVE NOTE  12/05/2023 Traci May [id number]  Surgeon:  Ashley Akin, MD  First Assistant: Gershon Crane [facility]                               An experienced assistant was required given the complexity of this surgery and the standard of surgical care. The assistant was needed for exposure, dissection, suctioning, retraction of delicate tissues and sutures, instrument exchange and for overall help during this procedure.     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 She is at risk for propogation of the dissection if present and that concern along with position of stenosis would not be best to allow it to heal over the next few days. I have recommended CABG with LIMA to LAD.   Findings: The ventricle by TEE had normal ventricular function.  Vessel was visible on the surface of the heart.  The LAD was approximately a 1 and half to 2 mm vessel and there was some mild atherosclerotic plaquing of the vessel.  The mammary artery was excepted for bypass however it was a very small 1 to 1-1/2 mm vessel with again good flow at the conclusion of the procedure there was a well-functioning ventricle.  Preparation:  The patient was seen in the preoperative holding area and the correct patient, correct operation were confirmed with the patient after reviewing the medical record and catheterization. The consent was signed by me. Preoperative antibiotics were given. A pulmonary arterial line and radial arterial line were placed by the anesthesia team. The patient was taken back to the operating room and positioned supine on the operating room table.  After being placed under general endotracheal anesthesia by the anesthesia team a foley catheter was placed. The neck, chest, abdomen, and both legs were prepped with betadine soap and solution and draped in the usual sterile manner. A surgical time-out was taken and the correct patient and operative procedure were confirmed with the nursing and anesthesia staff.  Operation: Median sternotomy incision was created and the sternum was divided with a sternal saw.  Following this the left internal mammary artery was harvested and then packed in papaverine soaked sponge.  Full heparinization was then performed. Pericardial well was developed and the aortic cannulated with a 20 [place] Starns aortic cannula and a two-stage cannula was placed right atrium for venous return.  An antegrade cardioplegia catheter was placed in the ascending aorta.  Cardiopulmonary bypass was instituted and the aortic cross-clamp was placed.  Cold blood cardioplegia was then delivered antegrade for total of 5 minutes following arrest. The LAD was then opened proximally and utilizing the left internal mammary artery and end-to-side anastomosis was constructed with 8-0 Prolene sutures.  Was flushed here and found be hemostatic.  Pedicle was pexed the anterior surface there are 5-0 Prolene sutures. Aortic cross-clamp was removed and ventricular and atrial pacing wires were placed and brought out through inferior stab was and secured. Patient was then weaned from cardiogram bypass on inotropic support.  With good hemodynamics protamine was delivered and the patient was decannulated and sites oversewn were necessary.  Chest  tubes were brought inferior stab was and secured. With adequate hemostasis the sternum was reapproximated with interrupted stainless steel wire and the presternal subcutaneous tissue and skin were closed in multiple layers absorbable suture.  Sterile dressings were applied.

## 2023-12-05 NOTE — Anesthesia Procedure Notes (Signed)
Central Venous Catheter Insertion Performed by: Mariann Barter, MD, anesthesiologist Start/End12/06/2023 7:00 AM, 12/05/2023 7:10 AM Patient location: Pre-op. Preanesthetic checklist: patient identified, IV checked, site marked, risks and benefits discussed, surgical consent, monitors and equipment checked, pre-op evaluation, timeout performed and anesthesia consent Lidocaine 1% used for infiltration and patient sedated Hand hygiene performed  and maximum sterile barriers used  Catheter size: 8.5 Fr Sheath introducer Procedure performed using ultrasound guided technique. Ultrasound Notes:anatomy identified, needle tip was noted to be adjacent to the nerve/plexus identified, no ultrasound evidence of intravascular and/or intraneural injection and image(s) printed for medical record Attempts: 1 Following insertion, line sutured and dressing applied. Post procedure assessment: blood return through all ports, free fluid flow and no air  Patient tolerated the procedure well with no immediate complications.

## 2023-12-05 NOTE — Hospital Course (Signed)
  History of Present Illness:     Pt is a 47 yo female with acute onset of CP associated with Nausea, lightheadedness as walking up a flight of stairs this am going to work. She was seen in the ER with nonspecific EKG findings and had a rising troponin to over 300. She was taken to the cath lab where a very proximal LAD stenosis (SCAD) was found with evidence of hypokinetic distal anterior wall. She has remained pain free for the past several hours. She has no family history of dissections and her grandfather has had CABG  Following review of the patient and her studies Dr. Leafy Ro recommended proceeding with CABG.  Hospital course:  Following diagnostic evaluation and medical stabilization the patient was felt to be stable to proceed with surgery and on 12/05/2023 she was taken to the operating room at which time she underwent CABG x 1.  A LIMA-LAD graft was placed.  She tolerated the procedure well and was taken to the surgical intensive care unit in stable condition.  Postoperative hospital course:  She was extubated using standard post cardiac surgical protocols without difficulty.  She has remained hemodynamically stable.  All routine lines, monitors and drainage devices were discontinued in the standard fashion over time.  She was started on a course of routine diuresis for volume overload was expected related to surgery and not congestive heart failure.  She has an expected acute blood loss anemia which is being monitored clinically.  It is not at the transfusion threshold.  Renal functions remain within normal limits.

## 2023-12-05 NOTE — Progress Notes (Signed)
   Patient Name: Traci May Date of Encounter: 12/05/2023 [facility] Cardiologist: None   Interval Summary  .    Patient underwent single vessel CABG today with Dr. Leafy Ro. Returned to ICU intubated.   Vital Signs .    Vitals:   12/05/23 1130 12/05/23 1135 12/05/23 1145 12/05/23 1200  BP:   92/61 97/60  Pulse: 75 69 76 78  Resp: 16 16 16 16   Temp: (!) 95.9 F (35.5 C) (!) 95.9 F (35.5 C) (!) 96.1 F (35.6 C) (!) 96.3 F (35.7 C)  TempSrc:      SpO2: 100% 100% 100% 100%  Weight:        Intake/Output Summary (Last 24 hours) at 12/05/2023 1217 Last data filed at 12/05/2023 1200 Gross per 24 hour  Intake 3550.25 ml  Output 2866 ml  Net 684.25 ml      12/05/2023    4:00 AM 12/04/2023    3:56 PM 12/04/2023   12:59 PM  Last 3 Weights  Weight (lbs) 132 lb 11.5 oz 134 lb 7.7 oz 134 lb 14.7 oz  Weight (kg) 60.2 kg 61 kg 61.2 kg      Telemetry/ECG    Sinus rhythm - Personally Reviewed  Physical Exam .   GEN: No acute distress. Sedated. HEENT: Intubated. Cardiac: Normal rate, regular rhythm.  Respiratory: Ventilator sounds. GI: Soft, nontender, non-distended  MS: No edema  Assessment & Plan .     Traci May is a 47 y.o. female with no past medical history who presented on 12/6 with acute onset chest pain. LHC was done which showed ostial LAD 95% stenosis with spontaneous coronary artery dissection.   Underwent urgent single vessel CABG - LIMA to LAD on 12/05/23 with Dr. Leafy Ro.   #. SCAD of ostial LAD s/p CABG - Continue excellent post-operative care with our CTS and critical care colleagues.  - Will continue to follow post-operative course and arrange outpatient cardiology follow up.   For questions or updates, please contact [facility] Please consult [email or web address] for contact info under        Signed, Nobie Putnam, MD

## 2023-12-05 NOTE — Interval H&P Note (Signed)
History and Physical Interval Note:  12/05/2023 6:56 AM  Traci May  has presented today for surgery, with the diagnosis of CAD.  The various methods of treatment have been discussed with the patient and family. After consideration of risks, benefits and other options for treatment, the patient has consented to  Procedure(s): CORONARY ARTERY BYPASS GRAFTING (CABG) (N/A) TRANSESOPHAGEAL ECHOCARDIOGRAM (TEE) (N/A) as a surgical intervention.  The patient's history has been reviewed, patient examined, no change in status, stable for surgery.  I have reviewed the patient's chart and labs.  Questions were answered to the patient's satisfaction.     Eugenio Hoes

## 2023-12-05 NOTE — Consult Note (Signed)
NAME:  LEXES WAMPOLE, MRN:  [id number], DOB:  1976/04/23, LOS: 1 ADMISSION DATE:  12/04/2023, CONSULTATION DATE:  12/05/2023 REFERRING MD:  Dr. Leafy Ro, CHIEF COMPLAINT:  post opcabg    History of Present Illness:   47 yo FM, no prior cardiac history, no cad, not diabetic, non smoker. Presents with chest pain, EKG with ST changes, taken for LHC. 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 Dr. Leafy Ro too patient to OR for urgent CABG LIMA to LAD.   Patient seen post-op.   Pertinent  Medical History  History reviewed. No pertinent past medical history.   Significant Hospital Events: Including procedures, antibiotic start and stop dates in addition to other pertinent events     Interim History / Subjective:  Per HPI above   Objective   Blood pressure 122/68, pulse 77, temperature 98.8 F (37.1 C), temperature source Oral, resp. rate 14, weight 60.2 kg, last menstrual period 02/09/2019, SpO2 100%.        Intake/Output Summary (Last 24 hours) at 12/05/2023 1115 Last data filed at 12/05/2023 1055 Gross per 24 hour  Intake 3205 ml  Output 2331 ml  Net 874 ml   Filed Weights   12/05/23 0400  Weight: 60.2 kg    Examination: General: young FM, resting on vent support, post op  HENT: NCAT  Lungs: BL mechanically vented breaths  Cardiovascular: RRR s1 s2  Abdomen: soft nt nd  Extremities:  no edema  Neuro:  sedated on mechanical support  GU: deferred   Resolved Hospital Problem list     Assessment & Plan:   SCAD S/p CABG X1, LIMA to LAD  P: Post op vent support, as expected  Rapid weaning protocol  SAT SBT, wake up assessment  Line tubes remain in place  LTVV VAP ppx PAD sedation  ABG prn  Extubate soon    Best Practice (right click and "Reselect all SmartList Selections" daily)   Diet/type: NPO DVT prophylaxis: heparin injection 5,000 Units Start: 12/04/23 2200   Pressure ulcer(s): not present on admission  GI prophylaxis: N/A Lines: Central line Foley:   Yes, and it is still needed Code Status:  full code Last date of multidisciplinary goals of care discussion [per primary]  Labs   CBC: Recent Labs  Lab 12/04/23 0552 12/04/23 1936 12/05/23 0358 12/05/23 0501 12/05/23 0945 12/05/23 0949 12/05/23 0958 12/05/23 1014 12/05/23 1020  WBC 5.4 9.1 7.7  --   --   --   --   --   --   HGB 15.0 15.2* 15.7*   < > 10.2* 10.5* 9.5* 10.5* 10.9*  HCT 44.7 44.8 45.0   < > 29.9* 31.0* 28.0* 31.0* 32.0*  MCV 92.4 91.6 91.1  --   --   --   --   --   --   PLT 357 432* 405*  --  550*  --   --   --   --    < > = values in this interval not displayed.    Basic Metabolic Panel: Recent Labs  Lab 12/04/23 0552 12/04/23 1936 12/05/23 0358 12/05/23 0501 12/05/23 0813 12/05/23 0919 12/05/23 0938 12/05/23 0949 12/05/23 0958 12/05/23 1014 12/05/23 1020  NA 139  --  138   < > 141 138 138 138 138 135 136  K 3.8  --  3.5   < > 4.0 3.9 4.0 5.2* 5.7* 5.2* 5.2*  CL 107  --  106  --  103 103  --   --  103  --  101  CO2 21*  --  21*  --   --   --   --   --   --   --   --   GLUCOSE 107*  --  98  --  105* 119*  --   --  119*  --  182*  BUN 17  --  11  --  11 10  --   --  9  --  9  CREATININE 0.73   < > 0.77  --  0.70 0.80  --   --  0.70  --  0.70  CALCIUM 8.9  --  9.3  --   --   --   --   --   --   --   --    < > = values in this interval not displayed.   GFR: Estimated Creatinine Clearance: 75.9 mL/min (by C-G formula based on SCr of 0.7 mg/dL). Recent Labs  Lab 12/04/23 0552 12/04/23 1936 12/05/23 0358  WBC 5.4 9.1 7.7    Liver Function Tests: No results for input(s): "AST", "ALT", "ALKPHOS", "BILITOT", "PROT", "ALBUMIN" in the last 168 hours. No results for input(s): "LIPASE", "AMYLASE" in the last 168 hours. No results for input(s): "AMMONIA" in the last 168 hours.  ABG    Component Value Date/Time   PHART 7.396 12/05/2023 1014   PCO2ART 36.6 12/05/2023 1014   PO2ART 363 (H) 12/05/2023 1014   HCO3 22.5 12/05/2023 1014   TCO2 23  12/05/2023 1020   ACIDBASEDEF 2.0 12/05/2023 1014   O2SAT 100 12/05/2023 1014     Coagulation Profile: Recent Labs  Lab 12/04/23 1107  INR 1.2    Cardiac Enzymes: No results for input(s): "CKTOTAL", "CKMB", "CKMBINDEX", "TROPONINI" in the last 168 hours.  HbA1C: Hgb A1c MFr Bld  Date/Time Value Ref Range Status  12/05/2023 03:58 AM 5.3 4.8 - 5.6 % Final    Comment:    (NOTE) Pre diabetes:          5.7%-6.4%  Diabetes:              >6.4%  Glycemic control for   <7.0% adults with diabetes     CBG: No results for input(s): "GLUCAP" in the last 168 hours.  Review of Systems:   Critically ill, intubated   Past Medical History:  She,  has no past medical history on file.   Surgical History:   Past Surgical History:  Procedure Laterality Date   BUNIONECTOMY Left 04/25/2020   Procedure: LAPIDUS TYPE LEFT;  Surgeon: Gwyneth Revels, DPM;  Location: [facility];  Service: Podiatry;  Laterality: Left;  general with local   BUNIONECTOMY Right 02/18/2023   Procedure: BUNIONECTOMY - LAPIDUS;  Surgeon: Gwyneth Revels, DPM;  Location: [facility];  Service: Podiatry;  Laterality: Right;   HALLUX VALGUS AKIN Left 04/25/2020   Procedure: HALLUX VALGUS AKIN;  Surgeon: Gwyneth Revels, DPM;  Location: [facility];  Service: Podiatry;  Laterality: Left;     Social History:   reports that she has never smoked. She has never used smokeless tobacco. She reports current alcohol use. She reports that she does not use drugs.   Family History:  Her family history is negative for Breast cancer.   Allergies No Known Allergies   Home Medications  Prior to Admission medications   Medication Sig Start Date End Date Taking? Authorizing Provider  Multiple Vitamin (MULTIVITAMIN) capsule Take 1 capsule by mouth daily.   Yes  [provider]     Critical care time: 62    Josephine Igo, DO [facility] 12/05/2023 1:31 PM

## 2023-12-05 NOTE — Transfer of Care (Signed)
Immediate Anesthesia Transfer of Care Note  Patient: Traci May  Procedure(s) Performed: CORONARY ARTERY BYPASS GRAFTING (CABG) TIMES ONE, USING LEFT INTERNAL MAMMARY ARTERY (Chest) TRANSESOPHAGEAL ECHOCARDIOGRAM (TEE) (Esophagus)  Patient Location: ICU  Anesthesia Type:General  Level of Consciousness: Patient remains intubated per anesthesia plan  Airway & Oxygen Therapy: Patient remains intubated per anesthesia plan and Patient placed on Ventilator (see vital sign flow sheet for setting)  Post-op Assessment: Report given to RN and Post -op Vital signs reviewed and stable  Post vital signs: Reviewed and stable  Last Vitals:  Vitals Value Taken Time  BP    Temp 35.6 C 12/05/23 1118  Pulse 72 12/05/23 1118  Resp 16 12/05/23 1118  SpO2 100 % 12/05/23 1118  Vitals shown include unfiled device data.  Last Pain:  Vitals:   12/05/23 0641  TempSrc: Oral  PainSc: 0-No pain         Complications: No notable events documented.

## 2023-12-05 NOTE — Anesthesia Procedure Notes (Signed)
Arterial Line Insertion Start/End12/06/2023 6:50 AM Performed by: Drema Pry, CRNA, CRNA  Patient location: PACU. Preanesthetic checklist: patient identified, IV checked, surgical consent, monitors and equipment checked and pre-op evaluation Lidocaine 1% used for infiltration Left, radial was placed Hand hygiene performed  and maximum sterile barriers used   Attempts: 2 Procedure performed using ultrasound guided technique. Following insertion, dressing applied and Biopatch. Post procedure assessment: normal  Patient tolerated the procedure well with no immediate complications.

## 2023-12-05 NOTE — Brief Op Note (Signed)
12/04/2023 - 12/05/2023  10:28 AM  PATIENT:  Traci May  47 y.o. female  PRE-OPERATIVE DIAGNOSIS:  CORONARY ARTERY DISEASE  POST-OPERATIVE DIAGNOSIS:  CORONARY ARTERY DISEASE  PROCEDURE:  Procedure(s): CORONARY ARTERY BYPASS GRAFTING (CABG) TIMES ONE, USING LEFT INTERNAL MAMMARY ARTERY (N/A) TRANSESOPHAGEAL ECHOCARDIOGRAM (TEE) (N/A) LIMA-LAD  SURGEON:  Surgeons and Role:    * Eugenio Hoes, MD - Primary  PHYSICIAN ASSISTANT: Carolee Channell PA-C  ASSISTANTS: Tanda Rockers RNFA   ANESTHESIA:   general  EBL:  1186 mL   BLOOD ADMINISTERED:none  DRAINS:  LEFT PLEURAL AND MEDIASTINAL CHEST TUBES(3)    LOCAL MEDICATIONS USED:  NONE  SPECIMEN:  No Specimen  DISPOSITION OF SPECIMEN:  N/A  COUNTS:  YES  TOURNIQUET:  * No tourniquets in log *  DICTATION: .Dragon Dictation  PLAN OF CARE: Admit to inpatient   PATIENT DISPOSITION:  ICU - intubated and hemodynamically stable.   Delay start of Pharmacological VTE agent (>24hrs) due to surgical blood loss or risk of bleeding: yes  COMPLICATIONS: NO KNOWN

## 2023-12-05 NOTE — Anesthesia Procedure Notes (Signed)
Procedure Name: Intubation Date/Time: 12/05/2023 8:03 AM  Performed by: Drema Pry, CRNAPre-anesthesia Checklist: Patient identified, Emergency Drugs available, Suction available and Patient being monitored Patient Re-evaluated:Patient Re-evaluated prior to induction Oxygen Delivery Method: Circle System Utilized Preoxygenation: Pre-oxygenation with 100% oxygen Induction Type: IV induction Ventilation: Mask ventilation without difficulty Laryngoscope Size: Mac and 3 Grade View: Grade I Tube type: Oral Tube size: 7.5 mm Number of attempts: 1 Airway Equipment and Method: Stylet Placement Confirmation: ETT inserted through vocal cords under direct vision, positive ETCO2 and breath sounds checked- equal and bilateral Secured at: 22 cm Tube secured with: Tape Dental Injury: Teeth and Oropharynx as per pre-operative assessment

## 2023-12-05 NOTE — Procedures (Signed)
Extubation Procedure Note  Patient Details:   Name: TESSIA ZIETZ DOB: 09-Aug-1976 MRN: [id number]   Airway Documentation:    Vent end date: 12/05/23 Vent end time: 1451   Evaluation  O2 sats: stable throughout Complications: No apparent complications Patient did tolerate procedure well. Bilateral Breath Sounds: Clear   Yes NIF -22  VC .89  Pos cuff leak was noted and pt was able to speak post procedure. Pt was placed on 2L [place]  Sharika Mosquera Aundria Mems 12/05/2023, 2:52 PM

## 2023-12-05 NOTE — Anesthesia Postprocedure Evaluation (Signed)
Anesthesia Post Note  Patient: MAJESTIC LOCHMANN  Procedure(s) Performed: CORONARY ARTERY BYPASS GRAFTING (CABG) TIMES ONE, USING LEFT INTERNAL MAMMARY ARTERY (Chest) TRANSESOPHAGEAL ECHOCARDIOGRAM (TEE) (Esophagus)     Patient location during evaluation: ICU Anesthesia Type: General Level of consciousness: sedated and patient remains intubated per anesthesia plan Pain management: pain level controlled Vital Signs Assessment: post-procedure vital signs reviewed and stable Respiratory status: patient remains intubated per anesthesia plan Cardiovascular status: stable Postop Assessment: no apparent nausea or vomiting Anesthetic complications: no   No notable events documented.  Last Vitals:  Vitals:   12/05/23 1145 12/05/23 1200  BP: 92/61 97/60  Pulse: 76 78  Resp: 16 16  Temp: (!) 35.6 C (!) 35.7 C  SpO2: 100% 100%    Last Pain:  Vitals:   12/05/23 1120  TempSrc: Core  PainSc:                  Beryle Lathe

## 2023-12-06 ENCOUNTER — Inpatient Hospital Stay (HOSPITAL_COMMUNITY): Payer: Commercial Managed Care - PPO

## 2023-12-06 DIAGNOSIS — I2542 Coronary artery dissection: Secondary | ICD-10-CM | POA: Diagnosis not present

## 2023-12-06 LAB — BASIC METABOLIC PANEL
Anion gap: 6 (ref 5–15)
Anion gap: 8 (ref 5–15)
BUN: 6 mg/dL (ref 6–20)
BUN: 10 mg/dL (ref 6–20)
CO2: 21 mmol/L — ABNORMAL LOW (ref 22–32)
CO2: 23 mmol/L (ref 22–32)
Calcium: 7.7 mg/dL — ABNORMAL LOW (ref 8.9–10.3)
Calcium: 8.4 mg/dL — ABNORMAL LOW (ref 8.9–10.3)
Chloride: 109 mmol/L (ref 98–111)
Chloride: 105 mmol/L (ref 98–111)
Creatinine, Ser: 0.64 mg/dL (ref 0.44–1.00)
Creatinine, Ser: 0.86 mg/dL (ref 0.44–1.00)
GFR, Estimated: >60 mL/min (ref >60)
GFR, Estimated: >60 mL/min (ref >60)
Glucose, Bld: 121 mg/dL — ABNORMAL HIGH (ref 70–99)
Glucose, Bld: 133 mg/dL — ABNORMAL HIGH (ref 70–99)
Potassium: 4.4 mmol/L (ref 3.5–5.1)
Potassium: 4.3 mmol/L (ref 3.5–5.1)
Sodium: 136 mmol/L (ref 135–145)
Sodium: 136 mmol/L (ref 135–145)

## 2023-12-06 LAB — CBC
HCT: 29.1 % — ABNORMAL LOW (ref 36.0–46.0)
HCT: 28.3 % — ABNORMAL LOW (ref 36.0–46.0)
HCT: 27.4 % — ABNORMAL LOW (ref 36.0–46.0)
Hemoglobin: 9.8 g/dL — ABNORMAL LOW (ref 12.0–15.0)
Hemoglobin: 9.4 g/dL — ABNORMAL LOW (ref 12.0–15.0)
Hemoglobin: 8.9 g/dL — ABNORMAL LOW (ref 12.0–15.0)
MCH: 31.7 pg (ref 26.0–34.0)
MCH: 31.1 pg (ref 26.0–34.0)
MCH: 31.1 pg (ref 26.0–34.0)
MCHC: 33.7 g/dL (ref 30.0–36.0)
MCHC: 33.2 g/dL (ref 30.0–36.0)
MCHC: 32.5 g/dL (ref 30.0–36.0)
MCV: 94.2 fL (ref 80.0–100.0)
MCV: 93.7 fL (ref 80.0–100.0)
MCV: 95.8 fL (ref 80.0–100.0)
Platelets: 468 10*3/uL — ABNORMAL HIGH (ref 150–400)
Platelets: 441 10*3/uL — ABNORMAL HIGH (ref 150–400)
Platelets: 381 10*3/uL (ref 150–400)
RBC: 3.09 MIL/uL — ABNORMAL LOW (ref 3.87–5.11)
RBC: 3.02 MIL/uL — ABNORMAL LOW (ref 3.87–5.11)
RBC: 2.86 MIL/uL — ABNORMAL LOW (ref 3.87–5.11)
RDW: 14.2 % (ref 11.5–15.5)
RDW: 14.3 % (ref 11.5–15.5)
RDW: 14.1 % (ref 11.5–15.5)
WBC: 14 10*3/uL — ABNORMAL HIGH (ref 4.0–10.5)
WBC: 12.7 10*3/uL — ABNORMAL HIGH (ref 4.0–10.5)
WBC: 12.4 10*3/uL — ABNORMAL HIGH (ref 4.0–10.5)
nRBC: 0 % (ref 0.0–0.2)
nRBC: 0 % (ref 0.0–0.2)
nRBC: 0 % (ref 0.0–0.2)

## 2023-12-06 LAB — GLUCOSE, CAPILLARY
Glucose-Capillary: 111 mg/dL — ABNORMAL HIGH (ref 70–99)
Glucose-Capillary: 115 mg/dL — ABNORMAL HIGH (ref 70–99)
Glucose-Capillary: 116 mg/dL — ABNORMAL HIGH (ref 70–99)
Glucose-Capillary: 118 mg/dL — ABNORMAL HIGH (ref 70–99)
Glucose-Capillary: 123 mg/dL — ABNORMAL HIGH (ref 70–99)
Glucose-Capillary: 125 mg/dL — ABNORMAL HIGH (ref 70–99)
Glucose-Capillary: 130 mg/dL — ABNORMAL HIGH (ref 70–99)
Glucose-Capillary: 131 mg/dL — ABNORMAL HIGH (ref 70–99)
Glucose-Capillary: 138 mg/dL — ABNORMAL HIGH (ref 70–99)
Glucose-Capillary: 146 mg/dL — ABNORMAL HIGH (ref 70–99)
Glucose-Capillary: 148 mg/dL — ABNORMAL HIGH (ref 70–99)
Glucose-Capillary: 99 mg/dL (ref 70–99)

## 2023-12-06 LAB — MAGNESIUM
Magnesium: 2.6 mg/dL — ABNORMAL HIGH (ref 1.7–2.4)
Magnesium: 2.2 mg/dL (ref 1.7–2.4)

## 2023-12-06 LAB — CREATININE, SERUM
Creatinine, Ser: 0.73 mg/dL (ref 0.44–1.00)
GFR, Estimated: >60 mL/min (ref >60)

## 2023-12-06 MED ORDER — FUROSEMIDE 10 MG/ML IJ SOLN
20.0000 mg | Freq: Once | INTRAMUSCULAR | Status: AC
  Administered 2023-12-06: 20 mg via INTRAVENOUS
  Filled 2023-12-06: qty 2

## 2023-12-06 MED ORDER — INSULIN ASPART 100 UNIT/ML IJ SOLN
0.0000 [IU] | INTRAMUSCULAR | Status: DC
  Administered 2023-12-06 (×3): 2 [IU] via SUBCUTANEOUS
  Administered 2023-12-07: 4 [IU] via SUBCUTANEOUS

## 2023-12-06 MED ORDER — ENOXAPARIN SODIUM 40 MG/0.4ML IJ SOSY
40.0000 mg | PREFILLED_SYRINGE | Freq: Every day | INTRAMUSCULAR | Status: DC
  Administered 2023-12-06 – 2023-12-08 (×3): 40 mg via SUBCUTANEOUS
  Filled 2023-12-06 (×3): qty 0.4

## 2023-12-06 NOTE — Progress Notes (Signed)
      [address]       [place] [address]             [phone]      1 Day Post-Op  Procedure(s) (LRB): CORONARY ARTERY BYPASS GRAFTING (CABG) TIMES ONE, USING LEFT INTERNAL MAMMARY ARTERY (N/A) TRANSESOPHAGEAL ECHOCARDIOGRAM (TEE) (N/A)   Total Length of Stay:  LOS: 2 days    SUBJECTIVE: She feels tired. No other issues  Vitals:   12/06/23 0700 12/06/23 0715  BP: 97/60   Pulse: 87 88  Resp: 20 19  Temp: 98.4 F (36.9 C) 98.6 F (37 C)  SpO2: 96% 96%    Intake/Output      12/07 0701 12/08 0700 12/08 0701 12/09 0700   P.O.     I.V. (mL/kg) 2673 (41.4)    Blood 570    IV Piggyback 1403.8    Total Intake(mL/kg) 4646.8 (71.9)    Urine (mL/kg/hr) 1615 (1)    Emesis/NG output 0    Blood 1186    Chest Tube 840 70   Total Output 3641 70   Net +1005.8 -70        Emesis Occurrence 1 x        sodium chloride     sodium chloride 10 mL/hr at 12/06/23 0700   albumin human 999 mL/hr at 12/06/23 0700    ceFAZolin (ANCEF) IV Stopped (12/06/23 0618)   dexmedetomidine (PRECEDEX) IV infusion Stopped (12/05/23 1241)   insulin 0.3 Units/hr (12/06/23 0700)   lactated ringers     lactated ringers 20 mL/hr at 12/06/23 0700   nitroGLYCERIN     phenylephrine (NEO-SYNEPHRINE) Adult infusion Stopped (12/06/23 0544)    CBC    Component Value Date/Time   WBC 14.0 (H) 12/06/2023 0357   RBC 3.09 (L) 12/06/2023 0357   HGB 9.8 (L) 12/06/2023 0357   HCT 29.1 (L) 12/06/2023 0357   PLT 468 (H) 12/06/2023 0357   MCV 94.2 12/06/2023 0357   MCH 31.7 12/06/2023 0357   MCHC 33.7 12/06/2023 0357   RDW 14.2 12/06/2023 0357   CMP     Component Value Date/Time   NA 136 12/06/2023 0357   K 4.4 12/06/2023 0357   CL 109 12/06/2023 0357   CO2 21 (L) 12/06/2023 0357   GLUCOSE 121 (H) 12/06/2023 0357   BUN 6 12/06/2023 0357   CREATININE 0.64 12/06/2023 0357   CALCIUM 7.7 (L) 12/06/2023 0357   GFRNONAA >60 12/06/2023 0357   ABG    Component Value Date/Time    PHART 7.283 (L) 12/05/2023 1604   PCO2ART 46.5 12/05/2023 1604   PO2ART 120 (H) 12/05/2023 1604   HCO3 22.1 12/05/2023 1604   TCO2 23 12/05/2023 1604   ACIDBASEDEF 5.0 (H) 12/05/2023 1604   O2SAT 98 12/05/2023 1604   CBG (last 3)  Recent Labs    12/06/23 0357 12/06/23 0449 12/06/23 0554  GLUCAP 123* 131* 99  EXAM Lung: clear with decreased at bases Card: RR Ext: warm Neuro: intact   ASSESSMENT: POD #1 SP Cabg Doing well will remove lines Leave chest tubes Diurese Ambulate   Eugenio Hoes, MD 12/06/2023

## 2023-12-06 NOTE — Progress Notes (Signed)
NAME:  KIARI DANZY, MRN:  [id number], DOB:  [birth year], LOS: 2 ADMISSION DATE:  12/04/2023, CONSULTATION DATE:  12/05/2023 REFERRING MD:  Dr. Leafy Ro, CHIEF COMPLAINT:  post opcabg    History of Present Illness:   47 yo FM, no prior cardiac history, no cad, not diabetic, non smoker. Presents with chest pain, EKG with ST changes, taken for LHC. 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 Dr. Leafy Ro too patient to OR for urgent CABG LIMA to LAD.   Patient seen post-op.   Pertinent  Medical History  History reviewed. No pertinent past medical history.   Significant Hospital Events: Including procedures, antibiotic start and stop dates in addition to other pertinent events     Interim History / Subjective:   No issues overnight.  Extubated with rapid weaning protocol.  Pain well-controlled  Objective   Blood pressure (!) 88/58, pulse 85, temperature 98.8 F (37.1 C), resp. rate (!) 23, weight 64.6 kg, last menstrual period 02/09/2019, SpO2 97%. PAP: (11-31)/(-7-13) 28/3 CVP:  [7 mmHg] 7 mmHg CO:  [3.3 L/min-5.2 L/min] 4.5 L/min CI:  [2 L/min/m2-3.1 L/min/m2] 2.7 L/min/m2  Vent Mode: PSV;CPAP FiO2 (%):  [40 %-50 %] 40 % Set Rate:  [4 bmp-16 bmp] 4 bmp Vt Set:  [440 mL] 440 mL PEEP:  [5 cmH20] 5 cmH20 Pressure Support:  [10 cmH20] 10 cmH20 Plateau Pressure:  [14 cmH20] 14 cmH20   Intake/Output Summary (Last 24 hours) at 12/06/2023 0932 Last data filed at 12/06/2023 0820 Gross per 24 hour  Intake 4145.76 ml  Output 3626 ml  Net 519.76 ml   Filed Weights   12/05/23 0400 12/06/23 0358  Weight: 60.2 kg 64.6 kg    Examination: General: Young female resting comfortably in bed awake alert following commands HENT: NCAT tracking Lungs: Bilateral mechanically ventilated breaths Cardiovascular: Regular rate rhythm S1-S2 Abdomen: Soft nontender nondistended Extremities: No edema Neuro: Awake alert following commands GU: deferred   Resolved Hospital Problem list      Assessment & Plan:   SCAD S/p CABG X1, 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  Best Practice (right click and "Reselect all SmartList Selections" daily)   Diet/type: NPO DVT prophylaxis: enoxaparin (LOVENOX) injection 40 mg Start: 12/06/23 2200 SCDs Start: 12/06/23 0736 SCDs Start: 12/05/23 1117   Pressure ulcer(s): not present on admission  GI prophylaxis: N/A Lines: Central line Foley:  Yes, and it is still needed Code Status:  full code Last date of multidisciplinary goals of care discussion [per primary]  Labs   CBC: Recent Labs  Lab 12/05/23 0358 12/05/23 0501 12/05/23 0945 12/05/23 0949 12/05/23 1120 12/05/23 1126 12/05/23 1444 12/05/23 1604 12/05/23 1736 12/06/23 0357 12/06/23 0809  WBC 7.7  --   --   --  21.3*  --   --   --  21.7* 14.0* 12.7*  HGB 15.7*   < > 10.2*   < > 12.0   < > 10.5* 10.5* 10.8* 9.8* 9.4*  HCT 45.0   < > 29.9*   < > 35.3*   < > 31.0* 31.0* 32.4* 29.1* 28.3*  MCV 91.1  --   --   --  92.7  --   --   --  95.3 94.2 93.7  PLT 405*  --  550*  --  457*  --   --   --  608* 468* 441*   < > = values in this interval not displayed.    Basic Metabolic Panel: Recent Labs  Lab 12/04/23 0552 12/04/23 1936 12/05/23 0358 12/05/23 0501 12/05/23 0919 12/05/23 0938 12/05/23 0958 12/05/23 1014 12/05/23 1020 12/05/23 1126 12/05/23 1444 12/05/23 1604 12/05/23 1736 12/06/23 0357 12/06/23 0809  NA 139  --  138   < > 138   < > 138   < > 136 138 140 140 138 136  --   K 3.8  --  3.5   < > 3.9   < > 5.7*   < > 5.2* 4.5 4.2 4.4 4.3 4.4  --   CL 107  --  106   < > 103  --  103  --  101  --   --   --  109 109  --   CO2 21*  --  21*  --   --   --   --   --   --   --   --   --  22 21*  --   GLUCOSE 107*  --  98   < > 119*  --  119*  --  182*  --   --   --  149* 121*  --   BUN 17  --  11   < > 10  --   9  --  9  --   --   --  8 6  --   CREATININE 0.73   < > 0.77   < > 0.80  --  0.70  --  0.70  --   --   --  0.74 0.64 0.73  CALCIUM 8.9  --  9.3  --   --   --   --   --   --   --   --   --  7.8* 7.7*  --   MG  --   --   --   --   --   --   --   --   --   --   --   --  3.5* 2.6*  --    < > = values in this interval not displayed.   GFR: Estimated Creatinine Clearance: 75.9 mL/min (by C-G formula based on SCr of 0.73 mg/dL). Recent Labs  Lab 12/05/23 1120 12/05/23 1736 12/06/23 0357 12/06/23 0809  WBC 21.3* 21.7* 14.0* 12.7*    Liver Function Tests: No results for input(s): "AST", "ALT", "ALKPHOS", "BILITOT", "PROT", "ALBUMIN" in the last 168 hours. No results for input(s): "LIPASE", "AMYLASE" in the last 168 hours. No results for input(s): "AMMONIA" in the last 168 hours.  ABG    Component Value Date/Time   PHART 7.283 (L) 12/05/2023 1604   PCO2ART 46.5 12/05/2023 1604   PO2ART 120 (H) 12/05/2023 1604   HCO3 22.1 12/05/2023 1604   TCO2 23 12/05/2023 1604   ACIDBASEDEF 5.0 (H) 12/05/2023 1604   O2SAT 98 12/05/2023 1604     Coagulation Profile: Recent Labs  Lab 12/04/23 1107 12/05/23 1120  INR 1.2 1.7*    Cardiac Enzymes: No results for input(s): "CKTOTAL", "CKMB", "CKMBINDEX", "TROPONINI" in the last 168 hours.  HbA1C: Hgb A1c MFr Bld  Date/Time Value Ref Range Status  12/05/2023 03:58 AM 5.3 4.8 - 5.6 % Final    Comment:    (NOTE) Pre diabetes:          5.7%-6.4%  Diabetes:              >  6.4%  Glycemic control for   <7.0% adults with diabetes     CBG: Recent Labs  Lab 12/06/23 0153 12/06/23 0249 12/06/23 0357 12/06/23 0449 12/06/23 0554  GLUCAP 125* 118* 123* 131* 99    Review of Systems:   Critically ill, intubated   Past Medical History:  She,  has no past medical history on file.   Surgical History:   Past Surgical History:  Procedure Laterality Date   BUNIONECTOMY Left 04/25/2020   Procedure: LAPIDUS TYPE LEFT;  Surgeon: Gwyneth Revels, DPM;  Location: [facility];  Service: Podiatry;  Laterality: Left;  general with local   BUNIONECTOMY Right 02/18/2023   Procedure: BUNIONECTOMY - LAPIDUS;  Surgeon: Gwyneth Revels, DPM;  Location: [facility];  Service: Podiatry;  Laterality: Right;   HALLUX VALGUS AKIN Left 04/25/2020   Procedure: HALLUX VALGUS AKIN;  Surgeon: Gwyneth Revels, DPM;  Location: [facility];  Service: Podiatry;  Laterality: Left;     Social History:   reports that she has never smoked. She has never used smokeless tobacco. She reports current alcohol use. She reports that she does not use drugs.   Family History:  Her family history is negative for Breast cancer.   Allergies No Known Allergies   Home Medications  Prior to Admission medications   Medication Sig Start Date End Date Taking? Authorizing Provider  Multiple Vitamin (MULTIVITAMIN) capsule Take 1 capsule by mouth daily.   Yes [provider]      Josephine Igo, DO [facility] 12/06/2023 9:33 AM

## 2023-12-06 NOTE — Discharge Summary (Signed)
[address]       [place] [address]             [phone]    Physician Discharge Summary  Patient ID: Traci May MRN: [id number] DOB/AGE: [birth year] 47 y.o.  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 She was seen in the ER with nonspecific EKG findings and had a rising troponin to over 300. She was taken to the cath lab where a very proximal LAD stenosis (SCAD) was found with evidence of hypokinetic distal anterior wall. She has remained pain free for the past several hours. She has no family history of dissections and her grandfather has had CABG  Following review of the patient and her studies Dr. Leafy Ro recommended proceeding with CABG.  Hospital course:  Following diagnostic evaluation and medical stabilization the patient was felt to be stable to proceed with surgery and on 12/05/2023 she was taken to the operating room at which time she underwent CABG x 1.  A LIMA-LAD graft was placed.  She tolerated the procedure well and was taken to the surgical intensive care unit in stable condition.  Postoperative hospital course:  She was extubated using standard post cardiac surgical protocols without difficulty.  She has remained  hemodynamically stable.  All routine lines, monitors and drainage devices were discontinued in the standard fashion over time.  She was started on a course of routine diuresis for volume overload was expected related to surgery and not congestive heart failure.  She has an expected acute blood loss anemia which is being monitored clinically.  It is not at the transfusion threshold.  Renal functions remain within normal limits.    Consults: {consultation:18241}  Significant Diagnostic Studies: {diagnostics:18242} DG Chest Port 1 View  Result Date: 12/06/2023 CLINICAL DATA:  Status post CABG EXAM: PORTABLE CHEST 1 VIEW COMPARISON:  Yesterday FINDINGS: Extubation. Removal of nasogastric tube. Right IJ Swan-Ganz catheter tip pulmonary outflow tract. Mediastinal drain and 2 left chest tubes again identified. Median sternotomy. Numerous leads and wires project over the chest. Midline trachea. Mild cardiomegaly. New small bilateral pleural effusions. The previously described left apical pneumothorax is no longer identified. Diminished lung volumes. Development of mild interstitial edema. Increased left and developing right basilar airspace disease. IMPRESSION: The tiny left apical pneumothorax is no longer identified. Development of mild interstitial edema with small bilateral pleural effusions and new/progressive bibasilar Airspace disease, likely atelectasis. Diminished lung volumes after extubation. Electronically Signed   By: Jeronimo Greaves M.D.   On: 12/06/2023 09:16   ECHO INTRAOPERATIVE TEE  Result Date: 12/05/2023  *INTRAOPERATIVE TRANSESOPHAGEAL REPORT *  Patient Name:   Traci May Date of Exam: 12/05/2023 Medical Rec #:  [id number]  Height:       64.0 in Accession #:    [id number]     Weight:       132.7 lb Date of Birth:  [birth year]      BSA:          1.64 m Patient Age:    47 years       BP:           125/65 mmHg Patient Gender: F              HR:           70 bpm. Exam Location:   Anesthesiology Transesophogeal exam was perform intraoperatively during surgical procedure. Patient was closely monitored under general anesthesia during the entirety of examination. Indications:     I25.110 Atherosclerotic heart disease of native coronary artery                  with unstable angina pectoris Performing Phys: Leslye Peer MD Diagnosing Phys: Leslye Peer MD PROCEDURE: Intraoperative Transesophogeal TEE probe [phone] used for procedure. Complications: No known complications during this procedure. POST-OP IMPRESSIONS _ Left Ventricle: The left ventricle is unchanged from pre-bypass. _ Right Ventricle: The right ventricle appears unchanged from pre-bypass. _ Aorta: The aorta appears unchanged from pre-bypass. _ Left Atrium: The left atrium appears unchanged from pre-bypass. _ Left Atrial Appendage: The left atrial appendage appears unchanged from pre-bypass. _ Aortic Valve: The aortic valve appears unchanged from pre-bypass. _ Mitral Valve: The mitral valve appears unchanged from pre-bypass. _ Tricuspid Valve: The tricuspid valve appears unchanged from pre-bypass. _ Pulmonic Valve: The pulmonic valve appears unchanged from pre-bypass. _ Interatrial Septum: The interatrial septum appears unchanged from pre-bypass. _ Interventricular Septum: The interventricular septum appears unchanged from pre-bypass. _ Pericardium: The pericardium appears unchanged from pre-bypass. PRE-OP FINDINGS  Left Ventricle: The left ventricle has low normal systolic function, with an ejection fraction of 50-55%. The cavity size was normal. There is borderline left ventricular hypertrophy.  LV Wall Scoring: Mild anteroapical hypokinesis.  Right Ventricle: The right ventricle has normal systolic function. The cavity was normal. There is no increase in right ventricular wall thickness. Left Atrium: Left atrial size was normal in size. Right Atrium: Right atrial size was normal in size. Interatrial Septum: No atrial level shunt  detected by color flow Doppler. Pericardium: Trivial pericardial effusion is present. The pericardial effusion is circumferential. Mitral Valve: The mitral valve is normal in structure. Mitral valve regurgitation is trivial by color flow Doppler. There is No evidence of mitral stenosis. Tricuspid Valve: The tricuspid valve was normal in structure. Tricuspid valve regurgitation was not visualized by color flow Doppler. No evidence of tricuspid stenosis is present. Aortic Valve: The aortic valve is tricuspid Aortic valve regurgitation was not visualized by color flow Doppler. There is no stenosis of the aortic valve. Pulmonic Valve: The pulmonic valve was normal in structure No evidence of pumonic stenosis. Pulmonic valve regurgitation is not visualized by color flow Doppler. Aorta: The aortic root, ascending aorta and aortic arch are normal in size and structure.  Leslye Peer MD Electronically signed by Leslye Peer MD Signature Date/Time: 12/05/2023/12:41:14 PM    Final    DG Chest Port 1 View  Result Date: 12/05/2023 CLINICAL DATA:  Status post CABG. EXAM: PORTABLE CHEST 1 VIEW COMPARISON:  12/04/2023 FINDINGS: Endotracheal tube tip is 3.3 cm above the base of the carina. The NG tube passes into the stomach although the distal tip position is not included on the  film. Right IJ pulmonary artery catheter tip is in the main pulmonary outflow tract. Mediastinal/pericardial drains overlie the midline. Left chest tube evidence with tiny apical left-sided pneumothorax. Trace atelectasis noted left base. No pulmonary edema or substantial pleural effusion. Telemetry leads overlie the chest. IMPRESSION: 1. Status post CABG with support apparatus as above. 2. Tiny apical left-sided pneumothorax with left chest tube in place. Electronically Signed   By: Kennith Center M.D.   On: 12/05/2023 11:42   ECHOCARDIOGRAM COMPLETE  Result Date: 12/04/2023    ECHOCARDIOGRAM REPORT   Patient Name:   Traci May Date of Exam:  12/04/2023 Medical Rec #:  [id number]      Height:       64.0 in Accession #:    [id number]     Weight:       134.5 lb Date of Birth:  Feb 25, 1976      BSA:          1.653 m Patient Age:    47 years       BP:           119/76 mmHg Patient Gender: F              HR:           66 bpm. Exam Location:  Inpatient Procedure: 2D Echo, Cardiac Doppler and Color Doppler Indications:    Preoperative evaluation. dissection of coronary artery  History:        Patient has no prior history of Echocardiogram examinations.                 Spontaneous dissection of coronary artery.  Sonographer:    Delcie Roch RDCS Referring Phys: [id number] SHENG L HALEY  Sonographer Comments: Global longitudinal strain was attempted. IMPRESSIONS  1. Left ventricular ejection fraction, by estimation, is 60 to 65%. The left ventricle has normal function. The left ventricle has no regional wall motion abnormalities. Left ventricular diastolic parameters were normal.  2. Right ventricular systolic function is normal. The right ventricular size is normal. Tricuspid regurgitation signal is inadequate for assessing PA pressure.  3. A small pericardial effusion is present.  4. The mitral valve is normal in structure. Trivial mitral valve regurgitation. No evidence of mitral stenosis.  5. The aortic valve is tricuspid. Aortic valve regurgitation is not visualized. No aortic stenosis is present.  6. The inferior vena cava is normal in size with greater than 50% respiratory variability, suggesting right atrial pressure of 3 mmHg. FINDINGS  Left Ventricle: Left ventricular ejection fraction, by estimation, is 60 to 65%. The left ventricle has normal function. The left ventricle has no regional wall motion abnormalities. The left ventricular internal cavity size was normal in size. There is  no left ventricular hypertrophy. Left ventricular diastolic parameters were normal. Right Ventricle: The right ventricular size is normal. No increase in right ventricular  wall thickness. Right ventricular systolic function is normal. Tricuspid regurgitation signal is inadequate for assessing PA pressure. Left Atrium: Left atrial size was normal in size. Right Atrium: Right atrial size was normal in size. Pericardium: A small pericardial effusion is present. Mitral Valve: The mitral valve is normal in structure. Trivial mitral valve regurgitation. No evidence of mitral valve stenosis. Tricuspid Valve: The tricuspid valve is normal in structure. Tricuspid valve regurgitation is trivial. Aortic Valve: The aortic valve is tricuspid. Aortic valve regurgitation is not visualized. No aortic stenosis is present. Pulmonic Valve: The pulmonic valve was not well visualized. Pulmonic valve regurgitation is  not visualized. Aorta: The aortic root and ascending aorta are structurally normal, with no evidence of dilitation. Venous: The inferior vena cava is normal in size with greater than 50% respiratory variability, suggesting right atrial pressure of 3 mmHg. IAS/Shunts: The interatrial septum was not well visualized.  LEFT VENTRICLE PLAX 2D LVIDd:         4.10 cm   Diastology LVIDs:         2.30 cm   LV e' medial:    8.16 cm/s LV PW:         0.90 cm   LV E/e' medial:  7.4 LV IVS:        0.90 cm   LV e' lateral:   11.50 cm/s LVOT diam:     2.00 cm   LV E/e' lateral: 5.2 LV SV:         64 LV SV Index:   39 LVOT Area:     3.14 cm  RIGHT VENTRICLE             IVC RV Basal diam:  2.20 cm     IVC diam: 1.30 cm RV S prime:     12.60 cm/s TAPSE (M-mode): 1.7 cm LEFT ATRIUM             Index        RIGHT ATRIUM           Index LA diam:        3.10 cm 1.88 cm/m   RA Area:     10.70 cm LA Vol (A2C):   35.9 ml 21.72 ml/m  RA Volume:   21.50 ml  13.01 ml/m LA Vol (A4C):   26.0 ml 15.73 ml/m LA Biplane Vol: 30.5 ml 18.45 ml/m  AORTIC VALVE LVOT Vmax:   111.00 cm/s LVOT Vmean:  65.900 cm/s LVOT VTI:    0.203 m  AORTA Ao Root diam: 2.70 cm Ao Asc diam:  3.00 cm MITRAL VALVE MV Area (PHT): 3.53 cm     SHUNTS MV Decel Time: 215 msec    Systemic VTI:  0.20 m MV E velocity: 60.00 cm/s  Systemic Diam: 2.00 cm MV A velocity: 59.30 cm/s MV E/A ratio:  1.01 Epifanio Lesches MD Electronically signed by Epifanio Lesches MD Signature Date/Time: 12/04/2023/7:16:21 PM    Final    CARDIAC CATHETERIZATION  Result Date: 12/04/2023   Ost LAD to Prox LAD lesion is 95% stenosed.   There is mild to moderate left ventricular systolic dysfunction.   The left ventricular ejection fraction is 35-45% by visual estimate. 1.  Spontaneous coronary artery dissection ostium LAD 2.  Mild to moderate reduced left ventricular function with anterior apical hypokinesis Recommendations 1.  [facility] for possible emergent CABG 2.  Defer IABP at this time, unless patient becomes clinically and/or hemodynamically unstable   DG Chest 2 View  Result Date: 12/04/2023 CLINICAL DATA:  Chest pain EXAM: CHEST - 2 VIEW COMPARISON:  None Available. FINDINGS: The heart size and mediastinal contours are within normal limits. Both lungs are clear. The visualized skeletal structures are unremarkable. IMPRESSION: No active cardiopulmonary disease. Electronically Signed   By: Kennith Center M.D.   On: 12/04/2023 06:24     Treatments: {Tx:18249} 12/05/2023 JAMAL ANTROBUS [id number]   Surgeon:  Ashley Akin, MD   First Assistant: Gershon Crane [facility]  Preoperative Diagnosis:  Spontaneous Coronary Artery Dissection (SCAD) of the proximal LAD   Postoperative Diagnosis:  Same     Procedure: Coronary artery bypass x 1 with the LIMA to the LAD   Anesthesia:  General Endotracheal  Discharge Exam: Blood pressure (!) 88/58, pulse 85, temperature 98.8 F (37.1 C), resp. rate 19, weight 64.6 kg, last menstrual period 02/09/2019, SpO2 96%. {physical [id number]   Discharge Medications:  The patient has been discharged on:   1.Beta Blocker:  Yes [   ]                              No    [   ]                              If No, reason:  2.Ace Inhibitor/ARB: Yes [   ]                                     No  [    ]                                     If No, reason:  3.Statin:   Yes [   ]                  No  [   ]                  If No, reason:  4.Ecasa:  Yes  [   ]                  No   [   ]                  If No, reason:  Patient had ACS upon admission:  Plavix/P2Y12 inhibitor: Yes [   ]                                      No  [   ]     Discharge Instructions     AMB Referral to Cardiac Rehabilitation - Phase II   Complete by: As directed    Diagnosis: CABG   CABG X ___: 1   After initial evaluation and assessments completed: Virtual Based Care may be provided alone or in conjunction with Phase 2 Cardiac Rehab based on patient barriers.: Yes   Intensive Cardiac Rehabilitation (ICR) [facility] location only OR Traditional Cardiac Rehabilitation (TCR) *If criteria for ICR are not met will enroll in TCR [facility] only): Yes      Allergies as of 12/06/2023   No Known Allergies   Med Rec must be completed prior to using this SMARTLINK***        Signed:  Rowe Clack, PA-C  12/06/2023, 9:46 AM

## 2023-12-06 NOTE — Plan of Care (Signed)
  Problem: Education: Goal: Knowledge of General Education information will improve Description: Including pain rating scale, medication(s)/side effects and non-pharmacologic comfort measures Outcome: Progressing   Problem: Health Behavior/Discharge Planning: Goal: Ability to manage health-related needs will improve Outcome: Progressing   Problem: Clinical Measurements: Goal: Ability to maintain clinical measurements within normal limits will improve Outcome: Progressing Goal: Will remain free from infection Outcome: Progressing Goal: Diagnostic test results will improve Outcome: Progressing Goal: Respiratory complications will improve Outcome: Progressing Goal: Cardiovascular complication will be avoided Outcome: Progressing   Problem: Nutrition: Goal: Adequate nutrition will be maintained Outcome: Progressing   Problem: Coping: Goal: Level of anxiety will decrease Outcome: Progressing   Problem: Elimination: Goal: Will not experience complications related to bowel motility Outcome: Progressing Goal: Will not experience complications related to urinary retention Outcome: Progressing   Problem: Pain Management: Goal: General experience of comfort will improve Outcome: Progressing   Problem: Safety: Goal: Ability to remain free from injury will improve Outcome: Progressing   Problem: Skin Integrity: Goal: Risk for impaired skin integrity will decrease Outcome: Progressing   Problem: Education: Goal: Understanding of cardiac disease, CV risk reduction, and recovery process will improve Outcome: Progressing Goal: Individualized Educational Video(s) Outcome: Progressing   Problem: Activity: Goal: Ability to tolerate increased activity will improve Outcome: Progressing   Problem: Cardiac: Goal: Ability to achieve and maintain adequate cardiovascular perfusion will improve Outcome: Progressing   Problem: Health Behavior/Discharge Planning: Goal: Ability to  safely manage health-related needs after discharge will improve Outcome: Progressing   Problem: Education: Goal: Will demonstrate proper wound care and an understanding of methods to prevent future damage Outcome: Progressing Goal: Knowledge of disease or condition will improve Outcome: Progressing Goal: Knowledge of the prescribed therapeutic regimen will improve Outcome: Progressing Goal: Individualized Educational Video(s) Outcome: Progressing   Problem: Activity: Goal: Risk for activity intolerance will decrease Outcome: Progressing   Problem: Respiratory: Goal: Respiratory status will improve Outcome: Progressing   Problem: Clinical Measurements: Goal: Postoperative complications will be avoided or minimized Outcome: Progressing   Problem: Skin Integrity: Goal: Wound healing without signs and symptoms of infection Outcome: Progressing Goal: Risk for impaired skin integrity will decrease Outcome: Progressing   Problem: Urinary Elimination: Goal: Ability to achieve and maintain adequate renal perfusion and functioning will improve Outcome: Progressing

## 2023-12-07 ENCOUNTER — Inpatient Hospital Stay (HOSPITAL_COMMUNITY): Payer: Commercial Managed Care - PPO

## 2023-12-07 ENCOUNTER — Encounter: Payer: Self-pay | Admitting: Cardiology

## 2023-12-07 DIAGNOSIS — I2542 Coronary artery dissection: Secondary | ICD-10-CM | POA: Diagnosis not present

## 2023-12-07 LAB — BASIC METABOLIC PANEL WITH GFR
Anion gap: 8 (ref 5–15)
BUN: 10 mg/dL (ref 6–20)
CO2: 23 mmol/L (ref 22–32)
Calcium: 8.1 mg/dL — ABNORMAL LOW (ref 8.9–10.3)
Chloride: 105 mmol/L (ref 98–111)
Creatinine, Ser: 0.7 mg/dL (ref 0.44–1.00)
GFR, Estimated: >60 mL/min
Glucose, Bld: 130 mg/dL — ABNORMAL HIGH (ref 70–99)
Potassium: 4.4 mmol/L (ref 3.5–5.1)
Sodium: 136 mmol/L (ref 135–145)

## 2023-12-07 LAB — CBC
HCT: 25.7 % — ABNORMAL LOW (ref 36.0–46.0)
Hemoglobin: 8.5 g/dL — ABNORMAL LOW (ref 12.0–15.0)
MCH: 31.7 pg (ref 26.0–34.0)
MCHC: 33.1 g/dL (ref 30.0–36.0)
MCV: 95.9 fL (ref 80.0–100.0)
Platelets: 383 10*3/uL (ref 150–400)
RBC: 2.68 MIL/uL — ABNORMAL LOW (ref 3.87–5.11)
RDW: 14.3 % (ref 11.5–15.5)
WBC: 11.8 10*3/uL — ABNORMAL HIGH (ref 4.0–10.5)
nRBC: 0 % (ref 0.0–0.2)

## 2023-12-07 LAB — GLUCOSE, CAPILLARY
Glucose-Capillary: 132 mg/dL — ABNORMAL HIGH (ref 70–99)
Glucose-Capillary: 136 mg/dL — ABNORMAL HIGH (ref 70–99)
Glucose-Capillary: 116 mg/dL — ABNORMAL HIGH (ref 70–99)
Glucose-Capillary: 192 mg/dL — ABNORMAL HIGH (ref 70–99)
Glucose-Capillary: 94 mg/dL (ref 70–99)

## 2023-12-07 MED ORDER — ORAL CARE MOUTH RINSE: 15.0000 mL | OROMUCOSAL | Status: DC | PRN

## 2023-12-07 MED ORDER — FUROSEMIDE 10 MG/ML IJ SOLN
20.0000 mg | Freq: Once | INTRAMUSCULAR | Status: AC
  Administered 2023-12-07: 20 mg via INTRAVENOUS
  Filled 2023-12-07: qty 2

## 2023-12-07 MED ORDER — AMIODARONE HCL 200 MG PO TABS
100.0000 mg | ORAL_TABLET | Freq: Two times a day (BID) | ORAL | Status: DC
  Administered 2023-12-07 – 2023-12-09 (×5): 100 mg via ORAL
  Filled 2023-12-07 (×5): qty 1

## 2023-12-07 MED FILL — Sodium Chloride IV Soln 0.9%: INTRAVENOUS | Qty: 1000 | Status: AC

## 2023-12-07 MED FILL — Electrolyte-A Solution: INTRAVENOUS | Qty: 500 | Status: AC

## 2023-12-07 MED FILL — Heparin Sodium (Porcine) Inj 1000 Unit/ML: INTRAMUSCULAR | Qty: 2.5 | Status: AC

## 2023-12-07 MED FILL — Papaverine HCl Inj 30 MG/ML: INTRAMUSCULAR | Qty: 2 | Status: AC

## 2023-12-07 MED FILL — Vancomycin HCl For IV Soln 1 GM (Base Equivalent): INTRAVENOUS | Qty: 20 | Status: AC

## 2023-12-07 NOTE — Progress Notes (Signed)
Pt arrived from 2 Heart. CHG bath, VSS, oriented to unit, CCMD called and verified. Significant other at bedside. Pt awaiting dinner tray. Pt resting with call bell within reach.  Will continue to monitor.

## 2023-12-07 NOTE — Progress Notes (Signed)
      [address]       [place] [address]             [phone]      2 Days Post-Op  Procedure(s) (LRB): CORONARY ARTERY BYPASS GRAFTING (CABG) TIMES ONE, USING LEFT INTERNAL MAMMARY ARTERY (N/A) TRANSESOPHAGEAL ECHOCARDIOGRAM (TEE) (N/A)   Total Length of Stay:  LOS: 3 days    SUBJECTIVE: Nauseated at times Ambulated  Vitals:   12/07/23 0520 12/07/23 0600  BP:  104/63  Pulse: 96 84  Resp: (!) 33 (!) 24  Temp:    SpO2: 94% 93%    Intake/Output      12/08 0701 12/09 0700 12/09 0701 12/10 0700   I.V. (mL/kg) 137.4 (2.2)    Blood     IV Piggyback 300.1    Total Intake(mL/kg) 437.5 (6.9)    Urine (mL/kg/hr) 1965 (1.3)    Emesis/NG output     Blood     Chest Tube 430    Total Output 2395    Net -1957.5             albumin human Stopped (12/06/23 0701)   nitroGLYCERIN     phenylephrine (NEO-SYNEPHRINE) Adult infusion Stopped (12/06/23 0544)    CBC    Component Value Date/Time   WBC 11.8 (H) 12/07/2023 0406   RBC 2.68 (L) 12/07/2023 0406   HGB 8.5 (L) 12/07/2023 0406   HCT 25.7 (L) 12/07/2023 0406   PLT 383 12/07/2023 0406   MCV 95.9 12/07/2023 0406   MCH 31.7 12/07/2023 0406   MCHC 33.1 12/07/2023 0406   RDW 14.3 12/07/2023 0406   CMP     Component Value Date/Time   NA 136 12/07/2023 0406   K 4.4 12/07/2023 0406   CL 105 12/07/2023 0406   CO2 23 12/07/2023 0406   GLUCOSE 130 (H) 12/07/2023 0406   BUN 10 12/07/2023 0406   CREATININE 0.70 12/07/2023 0406   CALCIUM 8.1 (L) 12/07/2023 0406   GFRNONAA >60 12/07/2023 0406   ABG    Component Value Date/Time   PHART 7.283 (L) 12/05/2023 1604   PCO2ART 46.5 12/05/2023 1604   PO2ART 120 (H) 12/05/2023 1604   HCO3 22.1 12/05/2023 1604   TCO2 23 12/05/2023 1604   ACIDBASEDEF 5.0 (H) 12/05/2023 1604   O2SAT 98 12/05/2023 1604   CBG (last 3)  Recent Labs    12/06/23 1936 12/06/23 2345 12/07/23 0408  GLUCAP 116* 111* 116*  EXAM Lungs: clear Card: RR Ext: warm Neuro:  intact   ASSESSMENT: POD #2 SP CABG Hemodynamics ok CT with too much drainage to remove but will get out pacing wires and sheath and send to floor Diurese today    Eugenio Hoes, MD 12/07/2023

## 2023-12-07 NOTE — Progress Notes (Signed)
Epicardial Wires Removed at 0840

## 2023-12-07 NOTE — Plan of Care (Signed)

## 2023-12-07 NOTE — Progress Notes (Signed)
   NAME:  Traci May, MRN:  [id number], DOB:  14-Jun-1976, LOS: 3 ADMISSION DATE:  12/04/2023, CONSULTATION DATE:  12/05/2023 REFERRING MD:  Leafy Ro - TCTS CHIEF COMPLAINT:  Post-op vent management s/p CABG  History of Present Illness:   47 year old woman with no prior cardiac history, no cad, not diabetic, non smoker. Presents with chest pain, EKG with ST changes, taken for LHC. 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 Dr. Leafy Ro took patient to OR for urgent CABG LIMA to LAD.   Patient seen post-op.   Pertinent Medical History:  History reviewed. No pertinent past medical history.   Significant Hospital Events: Including procedures, antibiotic start and stop dates in addition to other pertinent events   12/7 - Emergent CABG (LIMA to LAD) for suspected SCAD. Extubated.  Interim History / Subjective:  No significant overnight events Feeling well overall, appetite is ok, "taking things slow" CT with persistent output > 437mL/24H, to remain in place Wires removed Plan for transfer to floor today per TCTS  Objective:  Blood pressure 104/63, pulse 84, temperature 98 F (36.7 C), resp. rate (!) 24, height 5\' 4"  (1.626 m), weight 63.8 kg, last menstrual period 02/09/2019, SpO2 93%.        Intake/Output Summary (Last 24 hours) at 12/07/2023 0817 Last data filed at 12/07/2023 0700 Gross per 24 hour  Intake 397.26 ml  Output 2325 ml  Net -1927.74 ml   Filed Weights   12/06/23 0358 12/06/23 0800 12/07/23 0500  Weight: 64.6 kg 64.6 kg 63.8 kg   Physical Examination: General: Acutely ill-appearing woman in NAD. HEENT: [place]/AT, anicteric sclera, PERRL, moist mucous membranes. Neuro: Awake, oriented x 4. Responds to verbal stimuli. Following commands consistently. Moves all 4 extremities spontaneously. CV: RRR, no m/g/r. Midline sternotomy with dressing in place, clean/dry intact. CT in place with serosanguinous output. PULM: Breathing even and unlabored on RA. Lung fields CTAB,  slightly diminished at bases. GI: Soft, nontender, nondistended. Normoactive bowel sounds. Extremities: No LE edema noted. Trace edema of hands bilaterally. Skin: Warm/dry, no rashes.  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 Thank you for involving [place] in this patient's care. Please do not hesitate to reach out if PCCM can be of further assistance.  Best Practice: (right click and "Reselect all SmartList Selections" daily)   Diet/type: NPO DVT prophylaxis: enoxaparin (LOVENOX) injection 40 mg Start: 12/06/23 2200 SCDs Start: 12/06/23 0736   Pressure ulcer(s): not present on admission  GI prophylaxis: N/A Lines: Central line Foley:  Yes, and it is still needed Code Status:  full code Last date of multidisciplinary goals of care discussion [per primary]  Signature:   Faythe Ghee [facility] 12/07/23 8:18 AM  Please see [email or web address] for pager details.  From 7A-7P if no response, please call [phone] After hours, please call ELink [phone]

## 2023-12-07 NOTE — TOC Initial Note (Signed)
Transition of Care [facility]) - Initial/Assessment Note    Patient Details  Name: Traci May MRN: [id number] Date of Birth: 05/04/1976  Transition of Care [facility]) CM/SW Contact:    Elliot Cousin, RN Phone Number: [phone] 12/07/2023, 2:19 PM  Clinical Narrative:                 CM spoke to pt at bedside. States her SO lives with her at home. States she has contact number for [employer] to start her STD/FMLA.  Will continue to follow for dc needs.   Expected Discharge Plan: Home/Self Care Barriers to Discharge: Continued Medical Work up   Patient Goals and CMS Choice Patient states their goals for this hospitalization and ongoing recovery are:: wants to get better          Expected Discharge Plan and Services   Discharge Planning Services: CM Consult                                          Prior Living Arrangements/Services   Lives with:: Significant Other   Do you feel safe going back to the place where you live?: Yes      Need for Family Participation in Patient Care: Yes (Comment) Care giver support system in place?: Yes (comment)   Criminal Activity/Legal Involvement Pertinent to Current Situation/Hospitalization: No - Comment as needed  Activities of Daily Living   ADL Screening (condition at time of admission) Independently performs ADLs?: Yes (appropriate for developmental age) Is the patient deaf or have difficulty hearing?: No Does the patient have difficulty seeing, even when wearing glasses/contacts?: No Does the patient have difficulty concentrating, remembering, or making decisions?: No  Permission Sought/Granted Permission sought to share information with : Case Manager, Family Supports, PCP Permission granted to share information with : Yes, Verbal Permission Granted  Share Information with NAME: Kennon Holter     Permission granted to share info w Relationship: SO  Permission granted to share info w Contact Information:  [phone]  Emotional Assessment Appearance:: Appears stated age Attitude/Demeanor/Rapport: Engaged Affect (typically observed): Accepting Orientation: : Oriented to Self, Oriented to Place, Oriented to  Time, Oriented to Situation   Psych Involvement: No (comment)  Admission diagnosis:  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 [place] [address] Phone: [phone] Fax: [phone]     Social Determinants of Health (SDOH) Social History: SDOH Screenings   Food Insecurity: No Food Insecurity (12/04/2023)  Housing: Low Risk  (12/04/2023)  Transportation Needs: No Transportation Needs (12/04/2023)  Utilities: Not At Risk (12/04/2023)  Tobacco Use: Low Risk  (12/05/2023)   SDOH Interventions:     Readmission Risk Interventions     No data to display

## 2023-12-08 ENCOUNTER — Inpatient Hospital Stay (HOSPITAL_COMMUNITY): Payer: Commercial Managed Care - PPO

## 2023-12-08 LAB — CBC
HCT: 23.6 % — ABNORMAL LOW (ref 36.0–46.0)
Hemoglobin: 7.8 g/dL — ABNORMAL LOW (ref 12.0–15.0)
MCH: 31.5 pg (ref 26.0–34.0)
MCHC: 33.1 g/dL (ref 30.0–36.0)
MCV: 95.2 fL (ref 80.0–100.0)
Platelets: 337 10*3/uL (ref 150–400)
RBC: 2.48 MIL/uL — ABNORMAL LOW (ref 3.87–5.11)
RDW: 14.1 % (ref 11.5–15.5)
WBC: 10.6 10*3/uL — ABNORMAL HIGH (ref 4.0–10.5)
nRBC: 0 % (ref 0.0–0.2)

## 2023-12-08 LAB — BASIC METABOLIC PANEL
Anion gap: 7 (ref 5–15)
BUN: 12 mg/dL (ref 6–20)
CO2: 25 mmol/L (ref 22–32)
Calcium: 8.6 mg/dL — ABNORMAL LOW (ref 8.9–10.3)
Chloride: 106 mmol/L (ref 98–111)
Creatinine, Ser: 1.05 mg/dL — ABNORMAL HIGH (ref 0.44–1.00)
GFR, Estimated: >60 mL/min (ref >60)
Glucose, Bld: 111 mg/dL — ABNORMAL HIGH (ref 70–99)
Potassium: 4.5 mmol/L (ref 3.5–5.1)
Sodium: 138 mmol/L (ref 135–145)

## 2023-12-08 LAB — LIPOPROTEIN A (LPA): Lipoprotein (a): <8.4 nmol/L (ref <75.0)

## 2023-12-08 MED ORDER — POTASSIUM CHLORIDE CRYS ER 20 MEQ PO TBCR: 40.0000 meq | EXTENDED_RELEASE_TABLET | Freq: Every day | ORAL | Status: DC

## 2023-12-08 MED ORDER — FUROSEMIDE 40 MG PO TABS: 40.0000 mg | ORAL_TABLET | Freq: Every day | ORAL | Status: DC

## 2023-12-08 MED ORDER — FUROSEMIDE 10 MG/ML IJ SOLN
40.0000 mg | Freq: Once | INTRAMUSCULAR | Status: AC
  Administered 2023-12-08: 40 mg via INTRAVENOUS
  Filled 2023-12-08: qty 4

## 2023-12-08 MED ORDER — BISACODYL 5 MG PO TBEC: 10.0000 mg | DELAYED_RELEASE_TABLET | Freq: Every day | ORAL | Status: DC | PRN

## 2023-12-08 MED ORDER — POTASSIUM CHLORIDE CRYS ER 20 MEQ PO TBCR: 20.0000 meq | EXTENDED_RELEASE_TABLET | Freq: Every day | ORAL | Status: DC

## 2023-12-08 MED ORDER — DOCUSATE SODIUM 100 MG PO CAPS
200.0000 mg | ORAL_CAPSULE | Freq: Every day | ORAL | Status: DC | PRN
Start: 2023-12-08 — End: 2023-12-09

## 2023-12-08 MED ORDER — POTASSIUM CHLORIDE CRYS ER 20 MEQ PO TBCR
20.0000 meq | EXTENDED_RELEASE_TABLET | Freq: Every day | ORAL | Status: DC
  Administered 2023-12-08: 20 meq via ORAL
  Filled 2023-12-08: qty 1

## 2023-12-08 MED FILL — Potassium Chloride Inj 2 mEq/ML: INTRAVENOUS | Qty: 40 | Status: AC

## 2023-12-08 MED FILL — Magnesium Sulfate Inj 50%: INTRAMUSCULAR | Qty: 10 | Status: AC

## 2023-12-08 MED FILL — Heparin Sodium (Porcine) Inj 1000 Unit/ML: Qty: 1000 | Status: AC

## 2023-12-08 NOTE — Progress Notes (Addendum)
[address]       [place] [address]             [phone]      3 Days Post-Op Procedure(s) (LRB): CORONARY ARTERY BYPASS GRAFTING (CABG) TIMES ONE, USING LEFT INTERNAL MAMMARY ARTERY (N/A) TRANSESOPHAGEAL ECHOCARDIOGRAM (TEE) (N/A) Subjective: The patient states she is getting a headache but otherwise denies pain. States she has had 3 BMs since surgery.  Objective: Vital signs in last 24 hours: Temp:  [98 F (36.7 C)-98.7 F (37.1 C)] 98.1 F (36.7 C) (12/10 0412) Pulse Rate:  [77-106] 88 (12/09 2320) Cardiac Rhythm: Normal sinus rhythm (12/09 2100) Resp:  [12-38] 19 (12/10 0412) BP: (98-119)/(54-80) 117/62 (12/10 0412) SpO2:  [94 %-99 %] 98 % (12/10 0412) Weight:  [62.6 kg] 62.6 kg (12/10 0148)  Hemodynamic parameters for last 24 hours:    Intake/Output from previous day: 12/09 0701 - 12/10 0700 In: 493 [P.O.:480; I.V.:13] Out: 1385 [Urine:1200; Chest Tube:185] Intake/Output this shift: No intake/output data recorded.  General appearance: alert, cooperative, and no distress Neurologic: intact Heart: sinus tachycardia, no murmur Lungs: slightly diminished bibasilar breath sounds Abdomen: soft, non-tender; bowel sounds normal; no masses,  no organomegaly Extremities: edema trace Wound: Clean and dry without sign of infection  Lab Results: Recent Labs    12/07/23 0406 12/08/23 0323  WBC 11.8* 10.6*  HGB 8.5* 7.8*  HCT 25.7* 23.6*  PLT 383 337   BMET:  Recent Labs    12/07/23 0406 12/08/23 0323  NA 136 138  K 4.4 4.5  CL 105 106  CO2 23 25  GLUCOSE 130* 111*  BUN 10 12  CREATININE 0.70 1.05*  CALCIUM 8.1* 8.6*    PT/INR:  Recent Labs    12/05/23 1120  LABPROT 19.8*  INR 1.7*   ABG    Component Value Date/Time   PHART 7.283 (L) 12/05/2023 1604   HCO3 22.1 12/05/2023 1604   TCO2 23 12/05/2023 1604   ACIDBASEDEF 5.0 (H) 12/05/2023 1604   O2SAT 98 12/05/2023 1604   CBG (last 3)  Recent Labs    12/07/23 0408  12/07/23 0759 12/07/23 1146  GLUCAP 116* 192* 94    Assessment/Plan: S/P Procedure(s) (LRB): CORONARY ARTERY BYPASS GRAFTING (CABG) TIMES ONE, USING LEFT INTERNAL MAMMARY ARTERY (N/A) TRANSESOPHAGEAL ECHOCARDIOGRAM (TEE) (N/A)  Neuro: Pain controlled. Headache this AM, patient gets them at home. Usually improves with dark and quiet rest. Continue ASA and Tylenol.  CV: BP controlled on lopressor 12.5mg  BID, BP soft at times. ST, 105 this AM. BP restrictive of further BB titration at this time. On prophylactic Amiodarone 100mg  BID. NSTEMI on presentation with SCAD.Marland KitchenMarland KitchenPlavix?  Pulm: Saturating well on RA. CT in place to waterseal. CT output 15cc last shift, 185cc/24hrs. Will d/c CT. CXR yesterday with mild pulmonary vascular congestion and bibasilar atelectasis/small bibasilar pleural effusions. CXR today looks improved pending final read. Encourage IS and ambulation.IV diuresis today.  GI: +BM, lack of appetite but tolerating a diet  Endo: No hx of DM, CBGs controlled. Will d/c SSI and CBGs.   Renal: Cr 1.05, slight bump from yesterday. +6lbs. IV lasix and K supplement today  ID: Leukocytosis 10.6, likely reactive. Tmax 98.7   Expected postop ABLA: H/H 7.8/23.6, trending down. Not clinically significant as not at transfusion threshold at this time.  DVT Prophylaxis: Lovenox  Dispo: D/C CTs and diurese today. Increase ambulation. Plan to d/c home in the AM.    LOS: 4 days  Jenny Reichmann, PA-C 12/08/2023

## 2023-12-08 NOTE — Progress Notes (Signed)
CARDIAC REHAB PHASE I   Pt resting in bed. Complaint of increased pain in left upper back. RN made aware for pain medication. Pt would like to walk in hall little later today. Will continue to follow.   [id number] Woodroe Chen, RN BSN 12/08/2023 10:34 AM

## 2023-12-08 NOTE — Discharge Instructions (Signed)
Discharge Instructions:  1. You may shower, please wash incisions daily with soap and water and keep dry.  If you wish to cover wounds with dressing you may do so but please keep clean and change daily.  No tub baths or swimming until incisions have completely healed.  If your incisions become red or develop any drainage please call our office at [phone]  2. No Driving until cleared by Dr. Weldner's office and you are no longer using narcotic pain medications  3. Monitor your weight daily.. Please use the same scale and weigh at same time... If you gain 5-10 lbs in 48 hours with associated lower extremity swelling, please contact our office at [phone]  4. Fever of 101.5 for at least 24 hours with no source, please contact our office at [phone]  5. Activity- up as tolerated, please walk at least 3 times per day.  Avoid strenuous activity, no lifting, pushing, or pulling with your arms over 8-10 lbs for a minimum of 6 weeks  6. If any questions or concerns arise, please do not hesitate to contact our office at [phone]  

## 2023-12-09 ENCOUNTER — Inpatient Hospital Stay (HOSPITAL_COMMUNITY): Payer: Commercial Managed Care - PPO

## 2023-12-09 ENCOUNTER — Other Ambulatory Visit: Payer: Self-pay

## 2023-12-09 LAB — CBC
HCT: 23 % — ABNORMAL LOW (ref 36.0–46.0)
Hemoglobin: 7.5 g/dL — ABNORMAL LOW (ref 12.0–15.0)
MCH: 31.1 pg (ref 26.0–34.0)
MCHC: 32.6 g/dL (ref 30.0–36.0)
MCV: 95.4 fL (ref 80.0–100.0)
Platelets: 381 10*3/uL (ref 150–400)
RBC: 2.41 MIL/uL — ABNORMAL LOW (ref 3.87–5.11)
RDW: 14.1 % (ref 11.5–15.5)
WBC: 7.8 10*3/uL (ref 4.0–10.5)
nRBC: 0.3 % — ABNORMAL HIGH (ref 0.0–0.2)

## 2023-12-09 LAB — BASIC METABOLIC PANEL
Anion gap: 6 (ref 5–15)
BUN: 14 mg/dL (ref 6–20)
CO2: 25 mmol/L (ref 22–32)
Calcium: 8.3 mg/dL — ABNORMAL LOW (ref 8.9–10.3)
Chloride: 103 mmol/L (ref 98–111)
Creatinine, Ser: 0.74 mg/dL (ref 0.44–1.00)
GFR, Estimated: >60 mL/min (ref >60)
Glucose, Bld: 121 mg/dL — ABNORMAL HIGH (ref 70–99)
Potassium: 3.7 mmol/L (ref 3.5–5.1)
Sodium: 134 mmol/L — ABNORMAL LOW (ref 135–145)

## 2023-12-09 MED ORDER — CLOPIDOGREL BISULFATE 75 MG PO TABS
75.0000 mg | ORAL_TABLET | Freq: Every day | ORAL | Status: DC
  Administered 2023-12-09: 75 mg via ORAL
  Filled 2023-12-09: qty 1

## 2023-12-09 MED ORDER — ASPIRIN 325 MG PO TBEC: 325.0000 mg | DELAYED_RELEASE_TABLET | Freq: Every day | ORAL | Status: DC

## 2023-12-09 MED ORDER — FE FUM-VIT C-VIT B12-FA 460-60-0.01-1 MG PO CAPS
1.0000 | ORAL_CAPSULE | Freq: Every day | ORAL | Status: DC
  Administered 2023-12-09: 1 via ORAL
  Filled 2023-12-09: qty 1

## 2023-12-09 MED ORDER — ACETAMINOPHEN 325 MG PO TABS
650.0000 mg | ORAL_TABLET | Freq: Four times a day (QID) | ORAL | Status: DC | PRN
Start: 2023-12-09 — End: 2024-06-06

## 2023-12-09 MED ORDER — TRAMADOL HCL 50 MG PO TABS
50.0000 mg | ORAL_TABLET | Freq: Four times a day (QID) | ORAL | 0 refills | Status: DC | PRN
  Filled 2023-12-09: qty 28, 7d supply, fill #0

## 2023-12-09 MED ORDER — FUROSEMIDE 40 MG PO TABS
40.0000 mg | ORAL_TABLET | Freq: Every day | ORAL | Status: DC
  Administered 2023-12-09: 40 mg via ORAL
  Filled 2023-12-09: qty 1

## 2023-12-09 MED ORDER — METOPROLOL TARTRATE 25 MG PO TABS
12.5000 mg | ORAL_TABLET | Freq: Two times a day (BID) | ORAL | 1 refills | Status: DC
  Filled 2023-12-09: qty 30, 30d supply, fill #0
  Filled 2024-01-05: qty 30, 30d supply, fill #1

## 2023-12-09 MED ORDER — POTASSIUM CHLORIDE CRYS ER 20 MEQ PO TBCR
40.0000 meq | EXTENDED_RELEASE_TABLET | Freq: Once | ORAL | Status: AC
  Administered 2023-12-09: 40 meq via ORAL
  Filled 2023-12-09: qty 2

## 2023-12-09 MED ORDER — CLOPIDOGREL BISULFATE 75 MG PO TABS
75.0000 mg | ORAL_TABLET | Freq: Every day | ORAL | 1 refills | Status: DC
  Filled 2023-12-09: qty 30, 30d supply, fill #0
  Filled 2024-01-05: qty 30, 30d supply, fill #1

## 2023-12-09 MED ORDER — FUROSEMIDE 40 MG PO TABS
40.0000 mg | ORAL_TABLET | Freq: Every day | ORAL | 0 refills | Status: DC
  Filled 2023-12-09: qty 5, 5d supply, fill #0

## 2023-12-09 MED ORDER — IRON-VITAMIN C 65-125 MG PO TABS: 1.0000 | ORAL_TABLET | Freq: Every day | ORAL | Status: AC

## 2023-12-09 MED ORDER — POTASSIUM CHLORIDE CRYS ER 20 MEQ PO TBCR
20.0000 meq | EXTENDED_RELEASE_TABLET | Freq: Every day | ORAL | 0 refills | Status: DC
  Filled 2023-12-09: qty 5, 5d supply, fill #0

## 2023-12-09 MED FILL — Mannitol IV Soln 20%: INTRAVENOUS | Qty: 500 | Status: AC

## 2023-12-09 MED FILL — Sodium Chloride IV Soln 0.9%: INTRAVENOUS | Qty: 2000 | Status: AC

## 2023-12-09 MED FILL — Lidocaine HCl Local Soln Prefilled Syringe 100 MG/5ML (2%): INTRAMUSCULAR | Qty: 5 | Status: AC

## 2023-12-09 MED FILL — Heparin Sodium (Porcine) Inj 1000 Unit/ML: INTRAMUSCULAR | Qty: 10 | Status: AC

## 2023-12-09 MED FILL — Electrolyte-R (PH 7.4) Solution: INTRAVENOUS | Qty: 4000 | Status: AC

## 2023-12-09 MED FILL — Sodium Bicarbonate IV Soln 8.4%: INTRAVENOUS | Qty: 50 | Status: AC

## 2023-12-09 NOTE — TOC Transition Note (Signed)
Transition of Care (TOC) - CM/SW Discharge Note Donn Pierini RN, BSN Transitions of Care Unit 4E- RN Case Manager See Treatment Team for direct phone #   Patient Details  Name: Traci May MRN: [id number] Date of Birth: Mar 31, 1976  Transition of [facility]) CM/SW Contact:  Darrold Span, RN Phone Number: 12/09/2023, 11:42 AM   Clinical Narrative:    Pt stable for transition home today, Has transportation home. No HH or DME needs noted.  Pt to follow up as per AVS instructions.   No further TOC needs noted   Final next level of care: Home/Self Care Barriers to Discharge: Barriers Resolved   Patient Goals and CMS Choice   Choice offered to / list presented to : NA  Discharge Placement                 Home        Discharge Plan and Services Additional resources added to the After Visit Summary for     Discharge Planning Services: CM Consult Post Acute Care Choice: NA          DME Arranged: N/A DME Agency: NA       HH Arranged: NA HH Agency: NA        Social Determinants of Health (SDOH) Interventions SDOH Screenings   Food Insecurity: No Food Insecurity (12/04/2023)  Housing: Low Risk  (12/04/2023)  Transportation Needs: No Transportation Needs (12/04/2023)  Utilities: Not At Risk (12/04/2023)  Tobacco Use: Low Risk  (12/05/2023)     Readmission Risk Interventions    12/09/2023   11:42 AM  Readmission Risk Prevention Plan  Post Dischage Appt Complete  Medication Screening Complete  Transportation Screening Complete

## 2023-12-09 NOTE — Progress Notes (Signed)
      [address]       [place] [address]             [phone]      4 Days Post-Op Procedure(s) (LRB): CORONARY ARTERY BYPASS GRAFTING (CABG) TIMES ONE, USING LEFT INTERNAL MAMMARY ARTERY (N/A) TRANSESOPHAGEAL ECHOCARDIOGRAM (TEE) (N/A) Subjective: The patient has no complaints this AM. States she is ambulating well to the bathroom independently but was not walked in the hall yesterday.  Objective: Vital signs in last 24 hours: Temp:  [97.5 F (36.4 C)-98.5 F (36.9 C)] 98.5 F (36.9 C) (12/11 0456) Pulse Rate:  [88-99] 94 (12/11 0456) Cardiac Rhythm: Normal sinus rhythm (12/10 2029) Resp:  [17-26] 17 (12/10 2328) BP: (96-122)/(54-73) 107/73 (12/11 0456) SpO2:  [96 %-100 %] 99 % (12/11 0456) Weight:  [61.3 kg] 61.3 kg (12/11 0500)  Hemodynamic parameters for last 24 hours:    Intake/Output from previous day: No intake/output data recorded. Intake/Output this shift: No intake/output data recorded.  General appearance: alert, cooperative, and no distress Neurologic: intact Heart: regular rate and rhythm, S1, S2 normal, no murmur, click, rub or gallop Lungs: slightly diminished bibasilar breath sounds Abdomen: soft, non-tender; bowel sounds normal; no masses,  no organomegaly Extremities: edema trace Wound: Clean and dry without sign of infection  Lab Results: Recent Labs    12/08/23 0323 12/09/23 0335  WBC 10.6* 7.8  HGB 7.8* 7.5*  HCT 23.6* 23.0*  PLT 337 381   BMET:  Recent Labs    12/08/23 0323 12/09/23 0335  NA 138 134*  K 4.5 3.7  CL 106 103  CO2 25 25  GLUCOSE 111* 121*  BUN 12 14  CREATININE 1.05* 0.74  CALCIUM 8.6* 8.3*    PT/INR: No results for input(s): "LABPROT", "INR" in the last 72 hours. ABG    Component Value Date/Time   PHART 7.283 (L) 12/05/2023 1604   HCO3 22.1 12/05/2023 1604   TCO2 23 12/05/2023 1604   ACIDBASEDEF 5.0 (H) 12/05/2023 1604   O2SAT 98 12/05/2023 1604   CBG (last 3)  Recent Labs     12/07/23 0408 12/07/23 0759 12/07/23 1146  GLUCAP 116* 192* 94    Assessment/Plan: S/P Procedure(s) (LRB): CORONARY ARTERY BYPASS GRAFTING (CABG) TIMES ONE, USING LEFT INTERNAL MAMMARY ARTERY (N/A) TRANSESOPHAGEAL ECHOCARDIOGRAM (TEE) (N/A)  Neuro: Pain controlled this AM   CV: SBP 96-107 on Lopressor 12.5mg  BID. NSR this AM. On prophylactic Amiodarone 100mg  BID. Will d/c at discharge per Dr. Leafy Ro since she had no afib. NSTEMI on presentation with SCAD, will start Plavix.    Pulm: Saturating well on RA. CT removed yesterday. CXR with bibasilar atelectasis and small bilateral pleural effusions. Will continue Lasix. Encourage IS and ambulation.    GI: +BM, lack of appetite but tolerating a diet   Renal: Cr down to 0.74 from 1.05 yesterday. IV lasix was given yesterday. +3lbs will give PO Lasix and K supplement.    ID: Leukocytosis resolved. Tmax 98.5   Expected postop ABLA: H/H 7.5/23, trending down. Not clinically significant as not at transfusion threshold at this time. Will add Trigels.    DVT Prophylaxis: Lovenox   Dispo: Plan to d/c to home today. Pt must ambulate in the hall today prior to discharge.    LOS: 5 days    Jenny Reichmann, PA-C 12/09/2023

## 2023-12-09 NOTE — Progress Notes (Signed)
CARDIAC REHAB PHASE I     Post OHS education including site care, restrictions, heart healthy diet, sternal precautions, IS use at home, home needs at discharge, exercise guidelines and CRP2 reviewed. All questions and concerns addressed. Will refer to [facility] for CRP2. Plan for home today.   [id number]  Woodroe Chen, RN BSN 12/09/2023 9:40 AM

## 2023-12-09 NOTE — Progress Notes (Signed)
Patient discharged to home, AVS reviewed and all questions answered. IV removed, site is clean, dry, and intact.

## 2023-12-14 ENCOUNTER — Other Ambulatory Visit: Payer: Self-pay | Admitting: Thoracic Surgery (Cardiothoracic Vascular Surgery)

## 2023-12-14 DIAGNOSIS — Z951 Presence of aortocoronary bypass graft: Secondary | ICD-10-CM

## 2023-12-15 ENCOUNTER — Ambulatory Visit
Admission: RE | Admit: 2023-12-15 | Discharge: 2023-12-15 | Disposition: A | Discharge status: Home / Self Care | Payer: Commercial Managed Care - PPO | Source: Ambulatory Visit | Attending: Thoracic Surgery (Cardiothoracic Vascular Surgery) | Admitting: Thoracic Surgery (Cardiothoracic Vascular Surgery)

## 2023-12-15 ENCOUNTER — Ambulatory Visit (INDEPENDENT_AMBULATORY_CARE_PROVIDER_SITE_OTHER): Payer: Commercial Managed Care - PPO | Admitting: Surgical

## 2023-12-15 VITALS — BP 106/70 | HR 82 | Resp 20 | Ht 64.0 in | Wt 132.2 lb

## 2023-12-15 DIAGNOSIS — J90 Pleural effusion, not elsewhere classified: Secondary | ICD-10-CM | POA: Diagnosis not present

## 2023-12-15 DIAGNOSIS — Z951 Presence of aortocoronary bypass graft: Secondary | ICD-10-CM

## 2023-12-15 NOTE — Progress Notes (Signed)
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  She does describe some soreness as expected.  Her "energy level" remains relatively low but is improving over time.  She has not had any difficulty with her incisions.  She is not having shortness of breath, palpitations or lower extremity edema.  She has not had any fevers, chills or other significant constitutional symptoms.  She did have some mild constipation but it has resolved.  Overall she appears to be making steady progress.       No past medical history on file.   Social History   Tobacco Use  Smoking Status Never  Smokeless Tobacco Never    Social History   Substance and Sexual Activity  Alcohol Use Yes   Comment: occasional     No Known Allergies  Current Outpatient Medications  Medication Sig Dispense Refill   acetaminophen (TYLENOL) 325 MG tablet Take 2 tablets (650 mg total) by mouth every 6 (six) hours as needed for mild pain (pain score 1-3).     aspirin EC 325 MG tablet Take 1 tablet (325 mg total) by mouth daily.     clopidogrel (PLAVIX) 75 MG tablet Take 1 tablet (75 mg total) by mouth daily. 30 tablet 1    Iron-Vitamin C 65-125 MG TABS Take 1 tablet by mouth daily.     metoprolol tartrate (LOPRESSOR) 25 MG tablet Take 0.5 tablets (12.5 mg total) by mouth 2 (two) times daily. 30 tablet 1   Multiple Vitamin (MULTIVITAMIN) capsule Take 1 capsule by mouth daily.     traMADol (ULTRAM) 50 MG tablet Take 1 tablet (50 mg total) by mouth every 6 (six) hours as needed for moderate pain (pain score 4-6). (Patient not taking: Reported on 12/15/2023) 28 tablet 0   No current facility-administered medications for this visit.       Physical Exam: BP 106/70 (BP Location: Left Arm, Patient Position: Sitting, Cuff Size: Normal)   Pulse 82   Resp 20   Ht 5\' 4"  (1.626 m)   Wt 132 lb 3.2 oz (60 kg)   LMP 02/09/2019 Comment: postmenopause  SpO2 95% Comment: RA  BMI 22.69 kg/m   General appearance: alert, cooperative, and no distress Heart: regular rate and rhythm Lungs: Mildly decreased breath sounds in the bases. Extremities: No edema Wound: Incision healing well without evidence of infection.   Diagnostic Studies & Laboratory data:  Recent Radiology Findings:   No results found.    Recent Lab Findings: Lab Results  Component Value Date   WBC 7.8 12/09/2023   HGB 7.5 (L) 12/09/2023   HCT 23.0 (L) 12/09/2023   PLT 381 12/09/2023   GLUCOSE 121 (H) 12/09/2023   CHOL 152 12/05/2023   TRIG 50 12/05/2023   HDL 52 12/05/2023   LDLCALC 90 12/05/2023   NA 134 (L) 12/09/2023   K 3.7 12/09/2023   CL 103 12/09/2023   CREATININE 0.74 12/09/2023   BUN 14 12/09/2023   CO2 25 12/09/2023   TSH 1.857 12/04/2023   INR 1.7 (H) 12/05/2023   HGBA1C 5.3 12/05/2023      Assessment / Plan: Overall very steady progress.  I did review her chest x-ray and it shows improving atelectasis but some is still present.  There are no significant effusions.  I did not make any changes to her current medication regimen.  We will see the patient again in 2 to 3 weeks with a repeat chest x-ray and at that time may  be able to discontinue surgical follow-up to a as needed basis.      Medication Changes: No orders of the defined types were placed in this encounter.     Rowe Clack, PA-C  12/15/2023 12:26 PM

## 2023-12-15 NOTE — Patient Instructions (Signed)
Discussed routine activity restrictions and general measures in terms of surgical recovery

## 2023-12-16 ENCOUNTER — Telehealth: Payer: Self-pay

## 2023-12-16 NOTE — Telephone Encounter (Signed)
FMLA form completed and faxed to [employer] @ [phone]. Beginning LOA 12/04/23 through 02/29/24./ DOS 12/05/23

## 2023-12-24 ENCOUNTER — Other Ambulatory Visit: Payer: Self-pay | Admitting: *Deleted

## 2023-12-25 ENCOUNTER — Ambulatory Visit (HOSPITAL_BASED_OUTPATIENT_CLINIC_OR_DEPARTMENT_OTHER): Payer: Commercial Managed Care - PPO | Admitting: Family

## 2023-12-25 VITALS — BP 102/62 | HR 70 | Ht 64.0 in | Wt 136.5 lb

## 2023-12-25 DIAGNOSIS — I2542 Coronary artery dissection: Secondary | ICD-10-CM

## 2023-12-25 DIAGNOSIS — Z951 Presence of aortocoronary bypass graft: Secondary | ICD-10-CM | POA: Diagnosis not present

## 2023-12-25 DIAGNOSIS — R710 Precipitous drop in hematocrit: Secondary | ICD-10-CM | POA: Diagnosis not present

## 2023-12-25 DIAGNOSIS — D649 Anemia, unspecified: Secondary | ICD-10-CM | POA: Diagnosis not present

## 2023-12-25 DIAGNOSIS — I252 Old myocardial infarction: Secondary | ICD-10-CM

## 2023-12-25 DIAGNOSIS — Z9889 Other specified postprocedural states: Secondary | ICD-10-CM | POA: Diagnosis not present

## 2023-12-25 LAB — CBC
Hematocrit: 35.6 % (ref 34.0–46.6)
Hemoglobin: 11.1 g/dL (ref 11.1–15.9)
MCH: 30.8 pg (ref 26.6–33.0)
MCHC: 31.2 g/dL — ABNORMAL LOW (ref 31.5–35.7)
MCV: 99 fL — ABNORMAL HIGH (ref 79–97)
Platelets: 500 10*3/uL — ABNORMAL HIGH (ref 150–450)
RBC: 3.6 x10E6/uL — ABNORMAL LOW (ref 3.77–5.28)
RDW: 13.8 % (ref 11.7–15.4)
WBC: 6.4 10*3/uL (ref 3.4–10.8)

## 2023-12-25 NOTE — Patient Instructions (Addendum)
Medication Instructions:  Your physician recommends that you continue on your current medications as directed. Please refer to the Current Medication list given to you today.  *If you need a refill on your cardiac medications before your next appointment, please call your pharmacy*   Lab Work: Your physician recommends you get the following labs today: CBC  If you have labs (blood work) drawn today and your tests are completely normal, you will receive your results only by: MyChart Message (if you have MyChart) OR A paper copy in the mail If you have any lab test that is abnormal or we need to change your treatment, we will call you to review the results.  Follow-Up: At [facility] you and your health needs are our priority.  As part of our continuing mission to provide you with exceptional heart care, we have created designated Provider Care Teams.  These Care Teams include your primary Cardiologist (physician) and Advanced Practice Providers (APPs -  Physician Assistants and Nurse Practitioners) who all work together to provide you with the care you need, when you need it.  We recommend signing up for the patient portal called "MyChart".  Sign up information is provided on this After Visit Summary.  MyChart is used to connect with patients for Virtual Visits (Telemedicine).  Patients are able to view lab/test results, encounter notes, upcoming appointments, etc.  Non-urgent messages can be sent to your provider as well.   To learn more about what you can do with MyChart, go to [email or web address].    Your next appointment:   3 month(s)  Provider:   Yvonne Kendall, MD or APP    Other Instructions:    [facility] [address] [place],  [place]  [address] [phone]

## 2023-12-26 ENCOUNTER — Encounter (HOSPITAL_BASED_OUTPATIENT_CLINIC_OR_DEPARTMENT_OTHER): Payer: Self-pay | Admitting: Family

## 2023-12-26 NOTE — Progress Notes (Signed)
Cardiology Office Note:  .   Date:  12/26/2023  ID:  Traci May, DOB 02-03-1976, MRN [id number] PCP: Pcp, No  [facility] Providers Cardiologist:  None    History of Present Illness: [name]   Traci May is a 47 y.o. female with history of 12/04/23 NSTEMI due to SCAD s/p CABG x1 (LIMA-LAD) 12/05/23.  Presented 12/04/23 with NSTEMI with LHC at [facility] SCAD of ostium of LAD with 95% steonsis and LVEF 35-45%. Transferred to [facility] and underwent CABG x1 (LIMA-LAD) with Dr. Leafy Ro on 12/05/23. Intraoperative TEE with LVEF 50-55%. Lp(a) was normal. 12/05/23 lipid panel total cholesterol 152, HDL 52, LDL 90, triglycerides 50. Discharge don aspirin, plavix, low dose metoprolol. Expected postoperative anemia with discharge Hb 7.5, discharged on oral iron.   Discussed the use of AI scribe software for clinical note transcription with the patient, who gave verbal consent to proceed.    The patient, who recently underwent CABG surgery, presents for a follow-up visit with her husband. She reports feeling tired easily, which we discussed is likely multifactorial post-surgery and postoperative anemia. Managing her minimal pain with as-needed Tylenol and has been making an effort to stay active by walking around the house every couple of hours. Reports no palpitations, exertional dyspnea, lightheadedness, nor swelling.      ROS: Please see the history of present illness.    All other systems reviewed and are negative.   Studies Reviewed: .        Cardiac Studies & Procedures   CARDIAC CATHETERIZATION  CARDIAC CATHETERIZATION 12/04/2023  Narrative   Ost LAD to Prox LAD lesion is 95% stenosed.   There is mild to moderate left ventricular systolic dysfunction.   The left ventricular ejection fraction is 35-45% by visual estimate.  1.  Spontaneous coronary artery dissection ostium LAD 2.  Mild to moderate reduced left ventricular function with anterior apical  hypokinesis  Recommendations  1.  Transfer to [facility] for possible emergent CABG 2.  Defer IABP at this time, unless patient becomes clinically and/or hemodynamically unstable  Findings Coronary Findings Diagnostic  Dominance: Right  Left Anterior Descending Ost LAD to Prox LAD lesion is 95% stenosed. Spontaneous coronary artery dissection  Intervention  No interventions have been documented.    ECHOCARDIOGRAM  ECHOCARDIOGRAM COMPLETE 12/04/2023  Narrative ECHOCARDIOGRAM REPORT    Patient Name:   Traci May Date of Exam: 12/04/2023 Medical Rec #:  [id number]      Height:       64.0 in Accession #:    [id number]     Weight:       134.5 lb Date of Birth:  [birth year]      BSA:          1.653 m Patient Age:    47 years       BP:           119/76 mmHg Patient Gender: F              HR:           66 bpm. Exam Location:  Inpatient  Procedure: 2D Echo, Cardiac Doppler and Color Doppler  Indications:    Preoperative evaluation. dissection of coronary artery  History:        Patient has no prior history of Echocardiogram examinations. Spontaneous dissection of coronary artery.  Sonographer:    Delcie Roch RDCS Referring Phys: [id number] SHENG L HALEY   Sonographer Comments: Global longitudinal strain was attempted.  IMPRESSIONS   1. Left ventricular ejection fraction, by estimation, is 60 to 65%. The left ventricle has normal function. The left ventricle has no regional wall motion abnormalities. Left ventricular diastolic parameters were normal. 2. Right ventricular systolic function is normal. The right ventricular size is normal. Tricuspid regurgitation signal is inadequate for assessing PA pressure. 3. A small pericardial effusion is present. 4. The mitral valve is normal in structure. Trivial mitral valve regurgitation. No evidence of mitral stenosis. 5. The aortic valve is tricuspid. Aortic valve regurgitation is not visualized. No aortic stenosis is  present. 6. The inferior vena cava is normal in size with greater than 50% respiratory variability, suggesting right atrial pressure of 3 mmHg.  FINDINGS Left Ventricle: Left ventricular ejection fraction, by estimation, is 60 to 65%. The left ventricle has normal function. The left ventricle has no regional wall motion abnormalities. The left ventricular internal cavity size was normal in size. There is no left ventricular hypertrophy. Left ventricular diastolic parameters were normal.  Right Ventricle: The right ventricular size is normal. No increase in right ventricular wall thickness. Right ventricular systolic function is normal. Tricuspid regurgitation signal is inadequate for assessing PA pressure.  Left Atrium: Left atrial size was normal in size.  Right Atrium: Right atrial size was normal in size.  Pericardium: A small pericardial effusion is present.  Mitral Valve: The mitral valve is normal in structure. Trivial mitral valve regurgitation. No evidence of mitral valve stenosis.  Tricuspid Valve: The tricuspid valve is normal in structure. Tricuspid valve regurgitation is trivial.  Aortic Valve: The aortic valve is tricuspid. Aortic valve regurgitation is not visualized. No aortic stenosis is present.  Pulmonic Valve: The pulmonic valve was not well visualized. Pulmonic valve regurgitation is not visualized.  Aorta: The aortic root and ascending aorta are structurally normal, with no evidence of dilitation.  Venous: The inferior vena cava is normal in size with greater than 50% respiratory variability, suggesting right atrial pressure of 3 mmHg.  IAS/Shunts: The interatrial septum was not well visualized.   LEFT VENTRICLE PLAX 2D LVIDd:         4.10 cm   Diastology LVIDs:         2.30 cm   LV e' medial:    8.16 cm/s LV PW:         0.90 cm   LV E/e' medial:  7.4 LV IVS:        0.90 cm   LV e' lateral:   11.50 cm/s LVOT diam:     2.00 cm   LV E/e' lateral: 5.2 LV SV:          64 LV SV Index:   39 LVOT Area:     3.14 cm   RIGHT VENTRICLE             IVC RV Basal diam:  2.20 cm     IVC diam: 1.30 cm RV S prime:     12.60 cm/s TAPSE (M-mode): 1.7 cm  LEFT ATRIUM             Index        RIGHT ATRIUM           Index LA diam:        3.10 cm 1.88 cm/m   RA Area:     10.70 cm LA Vol (A2C):   35.9 ml 21.72 ml/m  RA Volume:   21.50 ml  13.01 ml/m LA Vol (A4C):   26.0 ml 15.73 ml/m  LA Biplane Vol: 30.5 ml 18.45 ml/m AORTIC VALVE LVOT Vmax:   111.00 cm/s LVOT Vmean:  65.900 cm/s LVOT VTI:    0.203 m  AORTA Ao Root diam: 2.70 cm Ao Asc diam:  3.00 cm  MITRAL VALVE MV Area (PHT): 3.53 cm    SHUNTS MV Decel Time: 215 msec    Systemic VTI:  0.20 m MV E velocity: 60.00 cm/s  Systemic Diam: 2.00 cm MV A velocity: 59.30 cm/s MV E/A ratio:  1.01  Epifanio Lesches MD Electronically signed by Epifanio Lesches MD Signature Date/Time: 12/04/2023/7:16:21 PM    Final  TEE  ECHO INTRAOPERATIVE TEE 12/05/2023  Narrative *INTRAOPERATIVE TRANSESOPHAGEAL REPORT *    Patient Name:   Traci May Date of Exam: 12/05/2023 Medical Rec #:  [id number]      Height:       64.0 in Accession #:    [id number]     Weight:       132.7 lb Date of Birth:  03-30-1976      BSA:          1.64 m Patient Age:    47 years       BP:           125/65 mmHg Patient Gender: F              HR:           70 bpm. Exam Location:  Anesthesiology  Transesophogeal exam was perform intraoperatively during surgical procedure. Patient was closely monitored under general anesthesia during the entirety of examination.  Indications:     I25.110 Atherosclerotic heart disease of native coronary artery with unstable angina pectoris Performing Phys: Leslye Peer MD Diagnosing Phys: Leslye Peer MD  PROCEDURE: Intraoperative Transesophogeal TEE probe [phone] used for procedure. Complications: No known complications during this procedure. POST-OP IMPRESSIONS _ Left Ventricle: The  left ventricle is unchanged from pre-bypass. _ Right Ventricle: The right ventricle appears unchanged from pre-bypass. _ Aorta: The aorta appears unchanged from pre-bypass. _ Left Atrium: The left atrium appears unchanged from pre-bypass. _ Left Atrial Appendage: The left atrial appendage appears unchanged from pre-bypass. _ Aortic Valve: The aortic valve appears unchanged from pre-bypass. _ Mitral Valve: The mitral valve appears unchanged from pre-bypass. _ Tricuspid Valve: The tricuspid valve appears unchanged from pre-bypass. _ Pulmonic Valve: The pulmonic valve appears unchanged from pre-bypass. _ Interatrial Septum: The interatrial septum appears unchanged from pre-bypass. _ Interventricular Septum: The interventricular septum appears unchanged from pre-bypass. _ Pericardium: The pericardium appears unchanged from pre-bypass.  PRE-OP FINDINGS Left Ventricle: The left ventricle has low normal systolic function, with an ejection fraction of 50-55%. The cavity size was normal. There is borderline left ventricular hypertrophy.   LV Wall Scoring: Mild anteroapical hypokinesis.   Right Ventricle: The right ventricle has normal systolic function. The cavity was normal. There is no increase in right ventricular wall thickness.  Left Atrium: Left atrial size was normal in size.  Right Atrium: Right atrial size was normal in size.  Interatrial Septum: No atrial level shunt detected by color flow Doppler.  Pericardium: Trivial pericardial effusion is present. The pericardial effusion is circumferential.  Mitral Valve: The mitral valve is normal in structure. Mitral valve regurgitation is trivial by color flow Doppler. There is No evidence of mitral stenosis.  Tricuspid Valve: The tricuspid valve was normal in structure. Tricuspid valve regurgitation was not visualized by color flow Doppler. No evidence of tricuspid stenosis is present.  Aortic Valve: The aortic  valve is tricuspid  Aortic valve regurgitation was not visualized by color flow Doppler. There is no stenosis of the aortic valve.   Pulmonic Valve: The pulmonic valve was normal in structure No evidence of pumonic stenosis. Pulmonic valve regurgitation is not visualized by color flow Doppler.   Aorta: The aortic root, ascending aorta and aortic arch are normal in size and structure.   Leslye Peer MD Electronically signed by Leslye Peer MD Signature Date/Time: 12/05/2023/12:41:14 PM    Final            Risk Assessment/Calculations:             Physical Exam:   VS:  BP 102/62   Pulse 70   Ht 5\' 4"  (1.626 m)   Wt 136 lb 8 oz (61.9 kg)   LMP 02/09/2019 Comment: postmenopause  SpO2 98%   BMI 23.43 kg/m    Wt Readings from Last 3 Encounters:  12/25/23 136 lb 8 oz (61.9 kg)  12/15/23 132 lb 3.2 oz (60 kg)  12/09/23 135 lb 2.3 oz (61.3 kg)    GEN: Well nourished, well developed in no acute distress NECK: No JVD; No carotid bruits CARDIAC: RRR, no murmurs, rubs, gallops. Midsternal incision with approximated edges and no evidence of infection. RESPIRATORY:  Clear to auscultation without rales, wheezing or rhonchi  ABDOMEN: Soft, non-tender, non-distended EXTREMITIES:  No edema; No deformity   ASSESSMENT AND PLAN: .       Hx of NSTEMI with SCAD s/p CABGx1 Minimal incisional pain managed by Tylenol. Incision healing appropriately. Continue DAPT Aspirin/Plavix for 1 year due to NSTEMI presentation. Continue Metoprolol Tartrate 12.5mg  BID, discussed could change to Toprol in future if needed for easier dosing. Discussed low suspicion Metoprolol/Plavix contributory to her fatigue and expected postoperatively. -Encourage gradual increase in physical activity at home. -Encouraged to participate in cardiac rehab pending clearance by TCTS.  Postoperative Anemia  Hemoglobin was 7.5 at discharge, likely contributing to fatigue.  -Continue iron supplement. -Check CBC to assess current hemoglobin  level and response to iron supplementation.        Cardiac Rehabilitation Eligibility Assessment  The patient is ready to start cardiac rehabilitation pending clearance from the cardiac surgeon.       Dispo: follow up in 3 months with Dr. Okey Dupre (transfer to [facility] office due to proximity to her home)  Signed, Alver Sorrow, NP

## 2024-01-07 ENCOUNTER — Ambulatory Visit: Payer: Commercial Managed Care - PPO | Admitting: Thoracic Surgery (Cardiothoracic Vascular Surgery)

## 2024-01-11 ENCOUNTER — Other Ambulatory Visit: Payer: Self-pay | Admitting: Thoracic Surgery (Cardiothoracic Vascular Surgery)

## 2024-01-11 DIAGNOSIS — Z951 Presence of aortocoronary bypass graft: Secondary | ICD-10-CM

## 2024-01-13 NOTE — Progress Notes (Signed)
[address]       [place] [address]             [phone]           AADHYA May [facility] Medical Record #[id number] Date of Birth: December 20, 1976  Traci May,* Pcp, No  Chief Complaint: follow up CABG    History of Present Illness:     Pt is now about 6 weeks out from CABx1 LIMA to LAD for acute LAD dissection. Pt reports that she is improving however incision and back R scapula area still uncomfortable. She has not walked much outside but feels her energy level improving. Tough to get comfortable in bed since she is a side sleeper      Past Medical History:  Diagnosis Date   Hx of CABG 12/05/2023   LIMA-LAD   NSTEMI (non-ST elevated myocardial infarction) (HCC) 12/04/2023   NSTEMI in setting of SCAD   Spontaneous dissection of coronary artery     Past Surgical History:  Procedure Laterality Date   BUNIONECTOMY Left 04/25/2020   Procedure: LAPIDUS TYPE LEFT;  Surgeon: Gwyneth Revels, DPM;  Location: [facility];  Service: Podiatry;  Laterality: Left;  general with local   BUNIONECTOMY Right 02/18/2023   Procedure: BUNIONECTOMY - LAPIDUS;  Surgeon: Gwyneth Revels, DPM;  Location: [facility];  Service: Podiatry;  Laterality: Right;   CORONARY ARTERY BYPASS GRAFT N/A 12/05/2023   Procedure: CORONARY ARTERY BYPASS GRAFTING (CABG) TIMES ONE, USING LEFT INTERNAL MAMMARY ARTERY;  Surgeon: Eugenio Hoes, MD;  Location: [facility] OR;  Service: Open Heart Surgery;  Laterality: N/A;   HALLUX VALGUS AKIN Left 04/25/2020   Procedure: HALLUX VALGUS AKIN;  Surgeon: Gwyneth Revels, DPM;  Location: [facility];  Service: Podiatry;  Laterality: Left;   LEFT HEART CATH AND CORONARY ANGIOGRAPHY N/A 12/04/2023   Procedure: LEFT HEART CATH AND CORONARY ANGIOGRAPHY;  Surgeon: Marcina Millard, MD;  Location: [facility] INVASIVE CV LAB;  Service: Cardiovascular;  Laterality: N/A;   TEE WITHOUT CARDIOVERSION N/A 12/05/2023   Procedure:  TRANSESOPHAGEAL ECHOCARDIOGRAM (TEE);  Surgeon: Eugenio Hoes, MD;  Location: [facility] OR;  Service: Open Heart Surgery;  Laterality: N/A;    Social History   Tobacco Use  Smoking Status Never  Smokeless Tobacco Never    Social History   Substance and Sexual Activity  Alcohol Use Yes   Comment: occasional    Social History   Socioeconomic History   Marital status: Single    Spouse name: Not on file   Number of children: Not on file   Years of education: Not on file   Highest education level: Not on file  Occupational History   Not on file  Tobacco Use   Smoking status: Never   Smokeless tobacco: Never  Vaping Use   Vaping status: Never Used  Substance and Sexual Activity   Alcohol use: Yes    Comment: occasional   Drug use: Never   Sexual activity: Not on file  Other Topics Concern   Not on file  Social History Narrative   Not on file   Social Drivers of Health   Financial Resource Strain: Not on file  Food Insecurity: No Food Insecurity (12/04/2023)   Hunger Vital Sign    Worried About Running Out of Food in the Last Year: Never true    Ran Out of Food in the Last Year: Never true  Transportation Needs: No Transportation Needs (12/04/2023)   PRAPARE - Transportation  Lack of Transportation (Medical): No    Lack of Transportation (Non-Medical): No  Physical Activity: Not on file  Stress: Not on file  Social Connections: Not on file  Intimate Partner Violence: Not At Risk (12/04/2023)   Humiliation, Afraid, Rape, and Kick questionnaire    Fear of Current or Ex-Partner: No    Emotionally Abused: No    Physically Abused: No    Sexually Abused: No    No Known Allergies  Current Outpatient Medications  Medication Sig Dispense Refill   acetaminophen (TYLENOL) 325 MG tablet Take 2 tablets (650 mg total) by mouth every 6 (six) hours as needed for mild pain (pain score 1-3).     aspirin EC 325 MG tablet Take 1 tablet (325 mg total) by mouth daily.     clopidogrel  (PLAVIX) 75 MG tablet Take 1 tablet (75 mg total) by mouth daily. 30 tablet 1   Iron-Vitamin C 65-125 MG TABS Take 1 tablet by mouth daily.     metoprolol tartrate (LOPRESSOR) 25 MG tablet Take 0.5 tablets (12.5 mg total) by mouth 2 (two) times daily. 30 tablet 1   Multiple Vitamin (MULTIVITAMIN) capsule Take 1 capsule by mouth daily.     traMADol (ULTRAM) 50 MG tablet Take 1 tablet (50 mg total) by mouth every 6 (six) hours as needed for moderate pain (pain score 4-6). (Patient not taking: Reported on 12/25/2023) 28 tablet 0   No current facility-administered medications for this visit.     Family History  Problem Relation Age of Onset   Breast cancer Neg Hx        Physical Exam: LMP 02/09/2019 Comment: postmenopause Lungs; clear  Card: RR  incision intact but sore to pressure Ext: Warm     Diagnostic Studies & Laboratory data: I have personally reviewed the following studies and agree with the findings     Recent Radiology Findings:   CXR today with completely clear lung fields    Recent Lab Findings: Lab Results  Component Value Date   WBC 6.4 12/25/2023   HGB 11.1 12/25/2023   HCT 35.6 12/25/2023   PLT 500 (H) 12/25/2023   GLUCOSE 121 (H) 12/09/2023   CHOL 152 12/05/2023   TRIG 50 12/05/2023   HDL 52 12/05/2023   LDLCALC 90 12/05/2023   NA 134 (L) 12/09/2023   K 3.7 12/09/2023   CL 103 12/09/2023   CREATININE 0.74 12/09/2023   BUN 14 12/09/2023   CO2 25 12/09/2023   TSH 1.857 12/04/2023   INR 1.7 (H) 12/05/2023   HGBA1C 5.3 12/05/2023      Assessment / Plan:     SP Cabg due to LAD dissection. Doing well but would benefit from Cardiac Rehab to increase her activity. She will discuss need for BB when she sees cardiology. She no longer needs it from post arrhythmia control. We reviewed restrictions and she can return to work at 2-3 months   I have spent 30 min in review of the records, viewing studies and in face to face with patient and in coordination  of future care    Eugenio Hoes 01/13/2024 3:10 PM

## 2024-01-14 ENCOUNTER — Encounter: Payer: Self-pay | Admitting: Thoracic Surgery (Cardiothoracic Vascular Surgery)

## 2024-01-14 ENCOUNTER — Other Ambulatory Visit: Payer: Self-pay

## 2024-01-14 ENCOUNTER — Ambulatory Visit: Payer: Self-pay | Admitting: Thoracic Surgery (Cardiothoracic Vascular Surgery)

## 2024-01-14 ENCOUNTER — Ambulatory Visit
Admission: RE | Admit: 2024-01-14 | Discharge: 2024-01-14 | Disposition: A | Discharge status: Home / Self Care | Payer: Commercial Managed Care - PPO | Source: Ambulatory Visit | Attending: Thoracic Surgery (Cardiothoracic Vascular Surgery) | Admitting: Thoracic Surgery (Cardiothoracic Vascular Surgery)

## 2024-01-14 VITALS — BP 108/74 | HR 69 | Resp 20 | Ht 64.0 in | Wt 134.0 lb

## 2024-01-14 DIAGNOSIS — Z951 Presence of aortocoronary bypass graft: Secondary | ICD-10-CM

## 2024-01-14 DIAGNOSIS — R918 Other nonspecific abnormal finding of lung field: Secondary | ICD-10-CM | POA: Diagnosis not present

## 2024-01-14 NOTE — Progress Notes (Signed)
Referral placed to outpatient Cardiac Rehab at [facility] per Dr. Karolee Ohs orders.

## 2024-01-14 NOTE — Patient Instructions (Signed)
Follow up prn

## 2024-01-26 ENCOUNTER — Encounter: Payer: Commercial Managed Care - PPO | Attending: Cardiology | Admitting: *Deleted

## 2024-01-26 DIAGNOSIS — Z48812 Encounter for surgical aftercare following surgery on the circulatory system: Secondary | ICD-10-CM | POA: Insufficient documentation

## 2024-01-26 DIAGNOSIS — Z951 Presence of aortocoronary bypass graft: Secondary | ICD-10-CM | POA: Insufficient documentation

## 2024-01-26 DIAGNOSIS — Z7902 Long term (current) use of antithrombotics/antiplatelets: Secondary | ICD-10-CM | POA: Insufficient documentation

## 2024-01-26 NOTE — Progress Notes (Signed)
Initial phone call completed. Diagnosis can be found in [facility] 12/6. EP Orientation scheduled for Wednesday 1/29 at 8am.

## 2024-01-27 VITALS — Ht 64.0 in | Wt 134.6 lb

## 2024-01-27 DIAGNOSIS — Z7902 Long term (current) use of antithrombotics/antiplatelets: Secondary | ICD-10-CM | POA: Diagnosis not present

## 2024-01-27 DIAGNOSIS — Z951 Presence of aortocoronary bypass graft: Secondary | ICD-10-CM

## 2024-01-27 DIAGNOSIS — Z48812 Encounter for surgical aftercare following surgery on the circulatory system: Secondary | ICD-10-CM | POA: Diagnosis not present

## 2024-01-27 NOTE — Progress Notes (Signed)
Cardiac Individual Treatment Plan  Patient Details  Name: SHAMONIQUE BATTISTE MRN: [id number] Date of Birth: 04/14/1976 Referring Provider:   Flowsheet Row Cardiac Rehab from 01/27/2024 in [facility] Cardiac and Pulmonary Rehab  Referring Provider Dr. Jodelle Red, MD       Initial Encounter Date:  Flowsheet Row Cardiac Rehab from 01/27/2024 in [facility] Cardiac and Pulmonary Rehab  Date 01/27/24       Visit Diagnosis: S/P CABG x 1  Patient's Home Medications on Admission:  Current Outpatient Medications:    acetaminophen (TYLENOL) 325 MG tablet, Take 2 tablets (650 mg total) by mouth every 6 (six) hours as needed for mild pain (pain score 1-3)., Disp: , Rfl:    aspirin EC 325 MG tablet, Take 1 tablet (325 mg total) by mouth daily., Disp: , Rfl:    clopidogrel (PLAVIX) 75 MG tablet, Take 1 tablet (75 mg total) by mouth daily., Disp: 30 tablet, Rfl: 1   Iron-Vitamin C 65-125 MG TABS, Take 1 tablet by mouth daily., Disp: , Rfl:    metoprolol tartrate (LOPRESSOR) 25 MG tablet, Take 0.5 tablets (12.5 mg total) by mouth 2 (two) times daily., Disp: 30 tablet, Rfl: 1   Multiple Vitamin (MULTIVITAMIN) capsule, Take 1 capsule by mouth daily., Disp: , Rfl:    traMADol (ULTRAM) 50 MG tablet, Take 1 tablet (50 mg total) by mouth every 6 (six) hours as needed for moderate pain (pain score 4-6). 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   Spontaneous dissection of coronary artery     Tobacco Use: Social History   Tobacco Use  Smoking Status Never  Smokeless Tobacco Never    Labs: Review Flowsheet       Latest Ref Rng & Units 12/05/2023  Labs for ITP Cardiac and Pulmonary Rehab  Cholestrol 0 - 200 mg/dL 956   LDL (calc) 0 - 99 mg/dL 90   HDL-C >38 mg/dL 52   Trlycerides  <756 mg/dL 50   Hemoglobin E3P 4.8 - 5.6 % 5.3   PH, Arterial 7.35 - 7.45 7.283  7.280  7.363  7.396  7.371  7.355  7.427   PCO2 arterial 32 - 48 mmHg 46.5  45.3  35.3  36.6  32.9  41.5  32.7   Bicarbonate 20.0 - 28.0 mmol/L 22.1  21.4  20.4  22.5  22.2  19.1  23.2  21.6   TCO2 22 - 32 mmol/L 23  23  22  23  24  25  23  20  24  27  24  23    Acid-base deficit 0.0 - 2.0 mmol/L 5.0  5.0  5.0  2.0  3.0  6.0  2.0  2.0   O2 Saturation % 98  99  100  100  76  100  100  99     Details       Multiple values from one day are sorted in reverse-chronological order          Exercise Target Goals: Exercise Program Goal: Individual exercise prescription set using results from initial 6 min walk test and THRR while considering  patient's activity barriers and safety.   Exercise Prescription Goal: Initial exercise prescription builds to 30-45 minutes a day of aerobic activity, 2-3 days per week.  Home  exercise guidelines will be given to patient during program as part of exercise prescription that the participant will acknowledge.   Education: Aerobic Exercise: - Group verbal and visual presentation on the components of exercise prescription. Introduces F.I.T.T principle from ACSM for exercise prescriptions.  Reviews F.I.T.T. principles of aerobic exercise including progression. Written material given at graduation.   Education: Resistance Exercise: - Group verbal and visual presentation on the components of exercise prescription. Introduces F.I.T.T principle from ACSM for exercise prescriptions  Reviews F.I.T.T. principles of resistance exercise including progression. Written material given at graduation.    Education: Exercise & Equipment Safety: - Individual verbal instruction and demonstration of equipment use and safety with use of the equipment. Flowsheet Row Cardiac Rehab from 01/27/2024 in [facility] Cardiac and Pulmonary Rehab  Date 01/27/24  Educator NT  Instruction Review Code 1-  Verbalizes Understanding       Education: Exercise Physiology & General Exercise Guidelines: - Group verbal and written instruction with models to review the exercise physiology of the cardiovascular system and associated critical values. Provides general exercise guidelines with specific guidelines to those with heart or lung disease.    Education: Flexibility, Balance, Mind/Body Relaxation: - Group verbal and visual presentation with interactive activity on the components of exercise prescription. Introduces F.I.T.T principle from ACSM for exercise prescriptions. Reviews F.I.T.T. principles of flexibility and balance exercise training including progression. Also discusses the mind body connection.  Reviews various relaxation techniques to help reduce and manage stress (i.e. Deep breathing, progressive muscle relaxation, and visualization). Balance handout provided to take home. Written material given at graduation.   Activity Barriers & Risk Stratification:  Activity Barriers & Cardiac Risk Stratification - 01/26/24 1403       Activity Barriers & Cardiac Risk Stratification   Activity Barriers None    Cardiac Risk Stratification High             6 Minute Walk:  6 Minute Walk     Row Name 01/27/24 0923         6 Minute Walk   Phase Initial     Distance 1455 feet     Walk Time 6 minutes     # of Rest Breaks 0     MPH 2.76     METS 4.37     RPE 7     Perceived Dyspnea  0     VO2 Peak 15.31     Symptoms No     Resting HR 71 bpm     Resting BP 102/64     Resting Oxygen Saturation  97 %     Exercise Oxygen Saturation  during 6 min walk 96 %     Max Ex. HR 82 bpm     Max Ex. BP 114/66     2 Minute Post BP 104/62              Oxygen Initial Assessment:   Oxygen Re-Evaluation:   Oxygen Discharge (Final Oxygen Re-Evaluation):   Initial Exercise Prescription:  Initial Exercise Prescription - 01/27/24 0900       Date of Initial Exercise RX and Referring  Provider   Date 01/27/24    Referring Provider Dr. Jodelle Red, MD      Oxygen   Maintain Oxygen Saturation 88% or higher      Treadmill   MPH 3    Grade 1    Minutes 15    METs 3.71      Elliptical   Level 1  Speed 3    Minutes 15    METs 4.37      REL-XR   Level 3    Speed 50    Minutes 15    METs 4.37      T5 Nustep   Level 3    SPM 80    Minutes 15    METs 4.37      Prescription Details   Frequency (times per week) 3    Duration Progress to 30 minutes of continuous aerobic without signs/symptoms of physical distress      Intensity   THRR 40-80% of Max Heartrate 112-153    Ratings of Perceived Exertion 11-13    Perceived Dyspnea 0-4      Progression   Progression Continue to progress workloads to maintain intensity without signs/symptoms of physical distress.      Resistance Training   Training Prescription Yes    Weight 7 lb    Reps 10-15             Perform Capillary Blood Glucose checks as needed.  Exercise Prescription Changes:   Exercise Prescription Changes     Row Name 01/27/24 0900             Response to Exercise   Blood Pressure (Admit) 102/64       Blood Pressure (Exercise) 114/66       Blood Pressure (Exit) 104/62       Heart Rate (Admit) 71 bpm       Heart Rate (Exercise) 82 bpm       Heart Rate (Exit) 72 bpm       Oxygen Saturation (Admit) 97 %       Oxygen Saturation (Exercise) 96 %       Rating of Perceived Exertion (Exercise) 7       Perceived Dyspnea (Exercise) 0       Symptoms none       Comments Results                Exercise Comments:   Exercise Goals and Review:   Exercise Goals     Row Name 01/27/24 0924             Exercise Goals   Increase Physical Activity Yes       Intervention Develop an individualized exercise prescription for aerobic and resistive training based on initial evaluation findings, risk stratification, comorbidities and participant's personal  goals.;Provide advice, education, support and counseling about physical activity/exercise needs.       Expected Outcomes Short Term: Attend rehab on a regular basis to increase amount of physical activity.;Long Term: Add in home exercise to make exercise part of routine and to increase amount of physical activity.;Long Term: Exercising regularly at least 3-5 days a week.       Increase Strength and Stamina Yes       Intervention Provide advice, education, support and counseling about physical activity/exercise needs.;Develop an individualized exercise prescription for aerobic and resistive training based on initial evaluation findings, risk stratification, comorbidities and participant's personal goals.       Expected Outcomes Short Term: Increase workloads from initial exercise prescription for resistance, speed, and METs.;Short Term: Perform resistance training exercises routinely during rehab and add in resistance training at home;Long Term: Improve cardiorespiratory fitness, muscular endurance and strength as measured by increased METs and functional capacity ( )       Able to understand and use rate of perceived exertion (RPE) scale Yes  Intervention Provide education and explanation on how to use RPE scale       Expected Outcomes Short Term: Able to use RPE daily in rehab to express subjective intensity level;Long Term:  Able to use RPE to guide intensity level when exercising independently       Able to understand and use Dyspnea scale Yes       Intervention Provide education and explanation on how to use Dyspnea scale       Expected Outcomes Short Term: Able to use Dyspnea scale daily in rehab to express subjective sense of shortness of breath during exertion;Long Term: Able to use Dyspnea scale to guide intensity level when exercising independently       Knowledge and understanding of Target Heart Rate Range (THRR) Yes       Intervention Provide education and explanation of THRR  including how the numbers were predicted and where they are located for reference       Expected Outcomes Short Term: Able to state/look up THRR;Long Term: Able to use THRR to govern intensity when exercising independently;Short Term: Able to use daily as guideline for intensity in rehab       Able to check pulse independently Yes       Intervention Provide education and demonstration on how to check pulse in carotid and radial arteries.;Review the importance of being able to check your own pulse for safety during independent exercise       Expected Outcomes Short Term: Able to explain why pulse checking is important during independent exercise;Long Term: Able to check pulse independently and accurately       Understanding of Exercise Prescription Yes       Intervention Provide education, explanation, and written materials on patient's individual exercise prescription       Expected Outcomes Short Term: Able to explain program exercise prescription;Long Term: Able to explain home exercise prescription to exercise independently                Exercise Goals Re-Evaluation :   Discharge Exercise Prescription (Final Exercise Prescription Changes):  Exercise Prescription Changes - 01/27/24 0900       Response to Exercise   Blood Pressure (Admit) 102/64    Blood Pressure (Exercise) 114/66    Blood Pressure (Exit) 104/62    Heart Rate (Admit) 71 bpm    Heart Rate (Exercise) 82 bpm    Heart Rate (Exit) 72 bpm    Oxygen Saturation (Admit) 97 %    Oxygen Saturation (Exercise) 96 %    Rating of Perceived Exertion (Exercise) 7    Perceived Dyspnea (Exercise) 0    Symptoms none    Comments Results             Nutrition:  Target Goals: Understanding of nutrition guidelines, daily intake of sodium 1500mg , cholesterol 200mg , calories 30% from fat and 7% or less from saturated fats, daily to have 5 or more servings of fruits and vegetables.  Education: All About Nutrition: -Group  instruction provided by verbal, written material, interactive activities, discussions, models, and posters to present general guidelines for heart healthy nutrition including fat, fiber, MyPlate, the role of sodium in heart healthy nutrition, utilization of the nutrition label, and utilization of this knowledge for meal planning. Follow up email sent as well. Written material given at graduation. Flowsheet Row Cardiac Rehab from 01/27/2024 in [facility] Cardiac and Pulmonary Rehab  Education need identified 01/27/24       Biometrics:  Pre Biometrics -  01/27/24 0924       Pre Biometrics   Height 5\' 4"  (1.626 m)    Weight 134 lb 9.6 oz (61.1 kg)    Waist Circumference 29 inches    Hip Circumference 35.5 inches    Waist to Hip Ratio 0.82 %    BMI (Calculated) 23.09    Single Leg Stand 30 seconds              Nutrition Therapy Plan and Nutrition Goals:  Nutrition Therapy & Goals - 01/27/24 0928       Nutrition Therapy   RD appointment deferred Yes      Intervention Plan   Intervention Prescribe, educate and counsel regarding individualized specific dietary modifications aiming towards targeted core components such as weight, hypertension, lipid management, diabetes, heart failure and other comorbidities.    Expected Outcomes Short Term Goal: Understand basic principles of dietary content, such as calories, fat, sodium, cholesterol and nutrients.;Short Term Goal: A plan has been developed with personal nutrition goals set during dietitian appointment.;Long Term Goal: Adherence to prescribed nutrition plan.             Nutrition Assessments:  MEDIFICTS Score Key: >=70 Need to make dietary changes  40-70 Heart Healthy Diet <= 40 Therapeutic Level Cholesterol Diet  Flowsheet Row Cardiac Rehab from 01/27/2024 in [facility] Cardiac and Pulmonary Rehab  Picture Your Plate Total Score on Admission 69      Picture Your Plate Scores: <56 Unhealthy dietary pattern with much room for  improvement. 41-50 Dietary pattern unlikely to meet recommendations for good health and room for improvement. 51-60 More healthful dietary pattern, with some room for improvement.  >60 Healthy dietary pattern, although there may be some specific behaviors that could be improved.    Nutrition Goals Re-Evaluation:   Nutrition Goals Discharge (Final Nutrition Goals Re-Evaluation):   Psychosocial: Target Goals: Acknowledge presence or absence of significant depression and/or stress, maximize coping skills, provide positive support system. Participant is able to verbalize types and ability to use techniques and skills needed for reducing stress and depression.   Education: Stress, Anxiety, and Depression - Group verbal and visual presentation to define topics covered.  Reviews how body is impacted by stress, anxiety, and depression.  Also discusses healthy ways to reduce stress and to treat/manage anxiety and depression.  Written material given at graduation.   Education: Sleep Hygiene -Provides group verbal and written instruction about how sleep can affect your health.  Define sleep hygiene, discuss sleep cycles and impact of sleep habits. Review good sleep hygiene tips.    Initial Review & Psychosocial Screening:  Initial Psych Review & Screening - 01/26/24 1406       Initial Review   Current issues with Current Sleep Concerns      Family Dynamics   Good Support System? Yes   boyfriend, mom     Barriers   Psychosocial barriers to participate in program There are no identifiable barriers or psychosocial needs.      Screening Interventions   Interventions Encouraged to exercise;Provide feedback about the scores to participant;To provide support and resources with identified psychosocial needs    Expected Outcomes Short Term goal: Utilizing psychosocial counselor, staff and physician to assist with identification of specific Stressors or current issues interfering with healing  process. Setting desired goal for each stressor or current issue identified.;Long Term Goal: Stressors or current issues are controlled or eliminated.;Short Term goal: Identification and review with participant of any Quality of Life or Depression  concerns found by scoring the questionnaire.;Long Term goal: The participant improves quality of Life and PHQ9 Scores as seen by post scores and/or verbalization of changes             Quality of Life Scores:   Quality of Life - 01/27/24 0928       Quality of Life   Select Quality of Life      Quality of Life Scores   Health/Function Pre 20.6 %    Socioeconomic Pre 15 %    Psych/Spiritual Pre 15 %    Family Pre 15 %    GLOBAL Pre 17.4 %            Scores of 19 and below usually indicate a poorer quality of life in these areas.  A difference of  2-3 points is a clinically meaningful difference.  A difference of 2-3 points in the total score of the Quality of Life Index has been associated with significant improvement in overall quality of life, self-image, physical symptoms, and general health in studies assessing change in quality of life.  PHQ-9: Review Flowsheet       01/27/2024  Depression screen PHQ 2/9  Decreased Interest 0  Down, Depressed, Hopeless 0  PHQ - 2 Score 0  Altered sleeping 1  Tired, decreased energy 1  Change in appetite 0  Feeling bad or failure about yourself  0  Trouble concentrating 0  Moving slowly or fidgety/restless 0  Suicidal thoughts 0  PHQ-9 Score 2  Difficult doing work/chores Not difficult at all   Interpretation of Total Score  Total Score Depression Severity:  1-4 = Minimal depression, 5-9 = Mild depression, 10-14 = Moderate depression, 15-19 = Moderately severe depression, 20-27 = Severe depression   Psychosocial Evaluation and Intervention:  Psychosocial Evaluation - 01/26/24 1415       Psychosocial Evaluation & Interventions   Interventions Encouraged to exercise with the program  and follow exercise prescription    Comments Dimitra is coming to cardiac rehab post CABG x1. She states she feels like she is improving daily. Sleep is still a struggle since it is difficult to get comfortable in bed post op, but she notices it is getting slightly better than when she first came home. She can rely on her boyfriend and mom as a support system. She plans to return to work in March. She wants to come to the program to gain confidence in exercising post surgery    Expected Outcomes Short: attend cardiac rehab for education and exercise. Long: develop and maintain positive self care habtis    Continue Psychosocial Services  Follow up required by staff             Psychosocial Re-Evaluation:   Psychosocial Discharge (Final Psychosocial Re-Evaluation):   Vocational Rehabilitation: Provide vocational rehab assistance to qualifying candidates.   Vocational Rehab Evaluation & Intervention:  Vocational Rehab - 01/26/24 1406       Initial Vocational Rehab Evaluation & Intervention   Assessment shows need for Vocational Rehabilitation No             Education: Education Goals: Education classes will be provided on a variety of topics geared toward better understanding of heart health and risk factor modification. Participant will state understanding/return demonstration of topics presented as noted by education test scores.  Learning Barriers/Preferences:  Learning Barriers/Preferences - 01/26/24 1405       Learning Barriers/Preferences   Learning Barriers None    Learning Preferences None  General Cardiac Education Topics:  AED/CPR: - Group verbal and written instruction with the use of models to demonstrate the basic use of the AED with the basic ABC's of resuscitation.   Anatomy and Cardiac Procedures: - Group verbal and visual presentation and models provide information about basic cardiac anatomy and function. Reviews the testing methods  done to diagnose heart disease and the outcomes of the test results. Describes the treatment choices: Medical Management, Angioplasty, or Coronary Bypass Surgery for treating various heart conditions including Myocardial Infarction, Angina, Valve Disease, and Cardiac Arrhythmias.  Written material given at graduation. Flowsheet Row Cardiac Rehab from 01/27/2024 in [facility] Cardiac and Pulmonary Rehab  Education need identified 01/27/24       Medication Safety: - Group verbal and visual instruction to review commonly prescribed medications for heart and lung disease. Reviews the medication, class of the drug, and side effects. Includes the steps to properly store meds and maintain the prescription regimen.  Written material given at graduation.   Intimacy: - Group verbal instruction through game format to discuss how heart and lung disease can affect sexual intimacy. Written material given at graduation..   Know Your Numbers and Heart Failure: - Group verbal and visual instruction to discuss disease risk factors for cardiac and pulmonary disease and treatment options.  Reviews associated critical values for Overweight/Obesity, Hypertension, Cholesterol, and Diabetes.  Discusses basics of heart failure: signs/symptoms and treatments.  Introduces Heart Failure Zone chart for action plan for heart failure.  Written material given at graduation.   Infection Prevention: - Provides verbal and written material to individual with discussion of infection control including proper hand washing and proper equipment cleaning during exercise session. Flowsheet Row Cardiac Rehab from 01/27/2024 in [facility] Cardiac and Pulmonary Rehab  Date 01/27/24  Educator NT  Instruction Review Code 1- Verbalizes Understanding       Falls Prevention: - Provides verbal and written material to individual with discussion of falls prevention and safety. Flowsheet Row Cardiac Rehab from 01/27/2024 in [facility] Cardiac and Pulmonary  Rehab  Date 01/27/24  Educator NT  Instruction Review Code 1- Verbalizes Understanding       Other: -Provides group and verbal instruction on various topics (see comments)   Knowledge Questionnaire Score:  Knowledge Questionnaire Score - 01/27/24 1610       Knowledge Questionnaire Score   Pre Score 23/26             Core Components/Risk Factors/Patient Goals at Admission:  Personal Goals and Risk Factors at Admission - 01/26/24 1405       Core Components/Risk Factors/Patient Goals on Admission   Heart Failure --    Intervention --    Expected Outcomes --             Education:Diabetes - Individual verbal and written instruction to review signs/symptoms of diabetes, desired ranges of glucose level fasting, after meals and with exercise. Acknowledge that pre and post exercise glucose checks will be done for 3 sessions at entry of program.   Core Components/Risk Factors/Patient Goals Review:    Core Components/Risk Factors/Patient Goals at Discharge (Final Review):    ITP Comments:  ITP Comments     Row Name 01/26/24 1414 01/27/24 0922         ITP Comments Initial phone call completed. Diagnosis can be found in [facility] 12/6. EP Orientation scheduled for Wednesday 1/29 at 8am. Completed and gym orientation. Initial ITP created and sent for review to Dr. Bethann Punches, Medical Director.  Comments: Initial ITP

## 2024-01-27 NOTE — Patient Instructions (Addendum)
Patient Instructions  Patient Details  Name: Traci May MRN: [id number] Date of Birth: [birth year] Referring Provider:  Jodelle Red,*  Below are your personal goals for exercise, nutrition, and risk factors. Our goal is to help you stay on track towards obtaining and maintaining these goals. We will be discussing your progress on these goals with you throughout the program.  Initial Exercise Prescription:  Initial Exercise Prescription - 01/27/24 0900       Date of Initial Exercise RX and Referring Provider   Date 01/27/24    Referring Provider Dr. Jodelle Red, MD      Oxygen   Maintain Oxygen Saturation 88% or higher      Treadmill   MPH 3    Grade 1    Minutes 15    METs 3.71      Elliptical   Level 1    Speed 3    Minutes 15    METs 4.37      REL-XR   Level 3    Speed 50    Minutes 15    METs 4.37      T5 Nustep   Level 3    SPM 80    Minutes 15    METs 4.37      Prescription Details   Frequency (times per week) 3    Duration Progress to 30 minutes of continuous aerobic without signs/symptoms of physical distress      Intensity   THRR 40-80% of Max Heartrate 112-153    Ratings of Perceived Exertion 11-13    Perceived Dyspnea 0-4      Progression   Progression Continue to progress workloads to maintain intensity without signs/symptoms of physical distress.      Resistance Training   Training Prescription Yes    Weight 7 lb    Reps 10-15             Exercise Goals: Frequency: Be able to perform aerobic exercise two to three times per week in program working toward 2-5 days per week of home exercise.  Intensity: Work with a perceived exertion of 11 (fairly light) - 15 (hard) while following your exercise prescription.  We will make changes to your prescription with you as you progress through the program.   Duration: Be able to do 30 to 45 minutes of continuous aerobic exercise in addition to a 5 minute warm-up and a 5  minute cool-down routine.   Nutrition Goals: Your personal nutrition goals will be established when you do your nutrition analysis with the dietician.  The following are general nutrition guidelines to follow: Cholesterol < 200mg /day Sodium < 1500mg /day Fiber: Women under 50 yrs - 25 grams per day  Personal Goals:  Personal Goals and Risk Factors at Admission - 01/26/24 1405       Core Components/Risk Factors/Patient Goals on Admission   Heart Failure --    Intervention --    Expected Outcomes --             Exercise Goals and Review:  Exercise Goals     Row Name 01/27/24 0924             Exercise Goals   Increase Physical Activity Yes       Intervention Develop an individualized exercise prescription for aerobic and resistive training based on initial evaluation findings, risk stratification, comorbidities and participant's personal goals.;Provide advice, education, support and counseling about physical activity/exercise needs.       Expected Outcomes Short  Term: Attend rehab on a regular basis to increase amount of physical activity.;Long Term: Add in home exercise to make exercise part of routine and to increase amount of physical activity.;Long Term: Exercising regularly at least 3-5 days a week.       Increase Strength and Stamina Yes       Intervention Provide advice, education, support and counseling about physical activity/exercise needs.;Develop an individualized exercise prescription for aerobic and resistive training based on initial evaluation findings, risk stratification, comorbidities and participant's personal goals.       Expected Outcomes Short Term: Increase workloads from initial exercise prescription for resistance, speed, and METs.;Short Term: Perform resistance training exercises routinely during rehab and add in resistance training at home;Long Term: Improve cardiorespiratory fitness, muscular endurance and strength as measured by increased METs and  functional capacity ( )       Able to understand and use rate of perceived exertion (RPE) scale Yes       Intervention Provide education and explanation on how to use RPE scale       Expected Outcomes Short Term: Able to use RPE daily in rehab to express subjective intensity level;Long Term:  Able to use RPE to guide intensity level when exercising independently       Able to understand and use Dyspnea scale Yes       Intervention Provide education and explanation on how to use Dyspnea scale       Expected Outcomes Short Term: Able to use Dyspnea scale daily in rehab to express subjective sense of shortness of breath during exertion;Long Term: Able to use Dyspnea scale to guide intensity level when exercising independently       Knowledge and understanding of Target Heart Rate Range (THRR) Yes       Intervention Provide education and explanation of THRR including how the numbers were predicted and where they are located for reference       Expected Outcomes Short Term: Able to state/look up THRR;Long Term: Able to use THRR to govern intensity when exercising independently;Short Term: Able to use daily as guideline for intensity in rehab       Able to check pulse independently Yes       Intervention Provide education and demonstration on how to check pulse in carotid and radial arteries.;Review the importance of being able to check your own pulse for safety during independent exercise       Expected Outcomes Short Term: Able to explain why pulse checking is important during independent exercise;Long Term: Able to check pulse independently and accurately       Understanding of Exercise Prescription Yes       Intervention Provide education, explanation, and written materials on patient's individual exercise prescription       Expected Outcomes Short Term: Able to explain program exercise prescription;Long Term: Able to explain home exercise prescription to exercise independently

## 2024-02-01 ENCOUNTER — Encounter: Payer: Commercial Managed Care - PPO | Attending: Cardiology

## 2024-02-01 DIAGNOSIS — Z48812 Encounter for surgical aftercare following surgery on the circulatory system: Secondary | ICD-10-CM | POA: Insufficient documentation

## 2024-02-01 DIAGNOSIS — Z951 Presence of aortocoronary bypass graft: Secondary | ICD-10-CM | POA: Diagnosis not present

## 2024-02-01 NOTE — Progress Notes (Signed)
Daily Session Note  Patient Details  Name: Traci May MRN: [id number] Date of Birth: 05/14/1976 Referring Provider:   Flowsheet Row Cardiac Rehab from 01/27/2024 in [facility] Cardiac and Pulmonary Rehab  Referring Provider Dr. Jodelle Red, MD       Encounter Date: 02/01/2024  Check In:  Session Check In - 02/01/24 0744       Check-In   Supervising physician immediately available to respond to emergencies See telemetry face sheet for immediately available ER MD    Location [facility]-Cardiac & Pulmonary Rehab    Staff Present Kelton Pillar RN,BSN,MPA;Jason Wallace Cullens RDN,LDN;Deejay Koppelman Madilyn Fireman BS, ACSM CEP, Exercise Physiologist;Joseph Reino Kent RCP,RRT,BSRT    Virtual Visit No    Medication changes reported     No    Fall or balance concerns reported    No    Warm-up and Cool-down Performed on first and last piece of equipment    Resistance Training Performed Yes    VAD Patient? No    PAD/SET Patient? No      Pain Assessment   Currently in Pain? No/denies                Social History   Tobacco Use  Smoking Status Never  Smokeless Tobacco Never    Goals Met:  Independence with exercise equipment Exercise tolerated well No report of concerns or symptoms today Strength training completed today  Goals Unmet:  Not Applicable  Comments: First full day of exercise!  Patient was oriented to gym and equipment including functions, settings, policies, and procedures.  Patient's individual exercise prescription and treatment plan were reviewed.  All starting workloads were established based on the results of the 6 minute walk test done at initial orientation visit.  The plan for exercise progression was also introduced and progression will be customized based on patient's performance and goals.    Dr. Bethann Punches is Medical Director for [facility] Cardiac Rehabilitation.  Dr. Vida Rigger is Medical Director for [facility] Pulmonary Rehabilitation.

## 2024-02-03 ENCOUNTER — Encounter: Payer: Commercial Managed Care - PPO | Admitting: *Deleted

## 2024-02-03 DIAGNOSIS — Z48812 Encounter for surgical aftercare following surgery on the circulatory system: Secondary | ICD-10-CM | POA: Diagnosis not present

## 2024-02-03 DIAGNOSIS — Z951 Presence of aortocoronary bypass graft: Secondary | ICD-10-CM

## 2024-02-03 NOTE — Progress Notes (Signed)
 Daily Session Note  Patient Details  Name: Traci May MRN: [id number] Date of Birth: March 19, 1976 Referring Provider:   Flowsheet Row Cardiac Rehab from 01/27/2024 in [facility] Cardiac [facility] Rehab  Referring Provider Dr. Shelda Bruckner, MD       Encounter Date: 02/03/2024  Check In:  Session Check In - 02/03/24 0751       Check-In   Supervising physician immediately available to respond to emergencies See telemetry face sheet for immediately available ER MD    Location [facility]-Cardiac & Pulmonary Rehab    Staff Present Maxon Conetta BS, Exercise Physiologist;Joseph Rolinda NORWOOD HARMAN SAMMIE;Othel Durand, RN, BSN, CCRP    Virtual Visit No    Medication changes reported     No    Fall or balance concerns reported    No    Warm-up and Cool-down Performed on first and last piece of equipment    Resistance Training Performed Yes    VAD Patient? No    PAD/SET Patient? No      Pain Assessment   Currently in Pain? No/denies                Social History   Tobacco Use  Smoking Status Never  Smokeless Tobacco Never    Goals Met:  Independence with exercise equipment Exercise tolerated well No report of concerns or symptoms today  Goals Unmet:  Not Applicable  Comments: Pt able to follow exercise prescription today without complaint.  Will continue to monitor for progression.    Dr. Oneil Pinal is Medical Director for [facility] Cardiac Rehabilitation.  Dr. Fuad Aleskerov is Medical Director for [facility] Pulmonary Rehabilitation.

## 2024-02-04 ENCOUNTER — Other Ambulatory Visit: Payer: Self-pay | Admitting: Physician Assistant

## 2024-02-05 ENCOUNTER — Other Ambulatory Visit: Payer: Self-pay

## 2024-02-05 ENCOUNTER — Encounter: Payer: Commercial Managed Care - PPO | Admitting: *Deleted

## 2024-02-05 DIAGNOSIS — Z48812 Encounter for surgical aftercare following surgery on the circulatory system: Secondary | ICD-10-CM | POA: Diagnosis not present

## 2024-02-05 DIAGNOSIS — Z951 Presence of aortocoronary bypass graft: Secondary | ICD-10-CM

## 2024-02-05 NOTE — Progress Notes (Signed)
 Daily Session Note  Patient Details  Name: Traci May MRN: [id number] Date of Birth: 02-16-1976 Referring Provider:   Flowsheet Row Cardiac Rehab from 01/27/2024 in [facility] Cardiac and Pulmonary Rehab  Referring Provider Dr. Shelda Bruckner, MD       Encounter Date: 02/05/2024  Check In:  Session Check In - 02/05/24 0757       Check-In   Supervising physician immediately available to respond to emergencies See telemetry face sheet for immediately available ER MD    Location [facility]-Cardiac & Pulmonary Rehab    Staff Present Othel Durand, RN, BSN, CCRP;Joseph Hood RCP,RRT,BSRT;Noah Tickle, [place], Exercise Physiologist    Virtual Visit No    Medication changes reported     No    Fall or balance concerns reported    No    Warm-up and Cool-down Performed on first and last piece of equipment    Resistance Training Performed Yes    VAD Patient? No    PAD/SET Patient? No      Pain Assessment   Currently in Pain? No/denies                Social History   Tobacco Use  Smoking Status Never  Smokeless Tobacco Never    Goals Met:  Independence with exercise equipment Exercise tolerated well No report of concerns or symptoms today  Goals Unmet:  Not Applicable  Comments: Pt able to follow exercise prescription today without complaint.  Will continue to monitor for progression.    Dr. Oneil Pinal is Medical Director for [facility] Cardiac Rehabilitation.  Dr. Fuad Aleskerov is Medical Director for [facility] Pulmonary Rehabilitation.

## 2024-02-08 ENCOUNTER — Encounter: Payer: Commercial Managed Care - PPO | Admitting: *Deleted

## 2024-02-08 ENCOUNTER — Other Ambulatory Visit: Payer: Self-pay

## 2024-02-08 ENCOUNTER — Telehealth: Payer: Self-pay | Admitting: Family

## 2024-02-08 DIAGNOSIS — Z48812 Encounter for surgical aftercare following surgery on the circulatory system: Secondary | ICD-10-CM | POA: Diagnosis not present

## 2024-02-08 DIAGNOSIS — Z951 Presence of aortocoronary bypass graft: Secondary | ICD-10-CM | POA: Diagnosis not present

## 2024-02-08 NOTE — Progress Notes (Signed)
 Daily Session Note  Patient Details  Name: Traci May MRN: [id number] Date of Birth: May 30, 1976 Referring Provider:   Flowsheet Row Cardiac Rehab from 01/27/2024 in [facility] Cardiac and Pulmonary Rehab  Referring Provider Dr. Sheryle Donning, MD       Encounter Date: 02/08/2024  Check In:  Session Check In - 02/08/24 0834       Check-In   Supervising physician immediately available to respond to emergencies See telemetry face sheet for immediately available ER MD    Location [facility]-Cardiac & Pulmonary Rehab    Staff Present Maud Sorenson, RN, BSN, CCRP;Kelly Hayes BS, ACSM CEP, Exercise Physiologist;Jason Martina Sledge RDN,LDN;[employer]    Virtual Visit No    Medication changes reported     No    Fall or balance concerns reported    No    Warm-up and Cool-down Performed on first and last piece of equipment    Resistance Training Performed Yes    VAD Patient? No    PAD/SET Patient? No      Pain Assessment   Currently in Pain? No/denies                Social History   Tobacco Use  Smoking Status Never  Smokeless Tobacco Never    Goals Met:  Independence with exercise equipment Exercise tolerated well No report of concerns or symptoms today  Goals Unmet:  Not Applicable  Comments: Pt able to follow exercise prescription today without complaint.  Will continue to monitor for progression.    Dr. Firman Hughes is Medical Director for [facility] Cardiac Rehabilitation.  Dr. Fuad Aleskerov is Medical Director for [facility] Pulmonary Rehabilitation.

## 2024-02-08 NOTE — Telephone Encounter (Signed)
*  STAT* If patient is at the pharmacy, call can be transferred to refill team.   1. Which medications need to be refilled? (please list name of each medication and dose if known)  clopidogrel  (PLAVIX ) 75 MG tablet  metoprolol  tartrate (LOPRESSOR ) 25 MG tablet    2. Would you like to learn more about the convenience, safety, & potential cost savings by using the [facility]?    3. Are you open to using the [facility] (Type [facility].  ).   4. Which pharmacy/location (including street and city if local pharmacy) is medication to be sent to?  [facility]     5. Do they need a 30 day or 90 day supply? 90 day

## 2024-02-09 ENCOUNTER — Other Ambulatory Visit: Payer: Self-pay

## 2024-02-09 MED ORDER — METOPROLOL TARTRATE 25 MG PO TABS
12.5000 mg | ORAL_TABLET | Freq: Two times a day (BID) | ORAL | 3 refills | Status: DC
  Filled 2024-02-09: qty 90, 90d supply, fill #0
  Filled 2024-05-01: qty 90, 90d supply, fill #1

## 2024-02-09 MED ORDER — CLOPIDOGREL BISULFATE 75 MG PO TABS
75.0000 mg | ORAL_TABLET | Freq: Every day | ORAL | 3 refills | Status: DC
  Filled 2024-02-09: qty 90, 90d supply, fill #0
  Filled 2024-05-01: qty 90, 90d supply, fill #1
  Filled 2024-07-31: qty 90, 90d supply, fill #2
  Filled 2024-10-10 – 2024-10-12 (×2): qty 90, 90d supply, fill #3
  Filled 2024-10-17: qty 90, 90d supply, fill #0

## 2024-02-09 NOTE — Telephone Encounter (Signed)
Patient is following up. She is completely out of medication and would like to have her prescription sent in soon if possible. Please call patient to confirm when prescription has been sent.

## 2024-02-10 ENCOUNTER — Encounter: Payer: Self-pay | Admitting: *Deleted

## 2024-02-10 DIAGNOSIS — Z951 Presence of aortocoronary bypass graft: Secondary | ICD-10-CM

## 2024-02-10 NOTE — Progress Notes (Signed)
Cardiac Individual Treatment Plan  Patient Details  Name: MERIT GADSBY MRN: [id number] Date of Birth: 07-Apr-1976 Referring Provider:   Flowsheet Row Cardiac Rehab from 01/27/2024 in [facility] Cardiac and Pulmonary Rehab  Referring Provider Dr. Jodelle Red, MD       Initial Encounter Date:  Flowsheet Row Cardiac Rehab from 01/27/2024 in [facility] Cardiac and Pulmonary Rehab  Date 01/27/24       Visit Diagnosis: S/P CABG x 1  Patient's Home Medications on Admission:  Current Outpatient Medications:    acetaminophen (TYLENOL) 325 MG tablet, Take 2 tablets (650 mg total) by mouth every 6 (six) hours as needed for mild pain (pain score 1-3)., Disp: , Rfl:    aspirin EC 325 MG tablet, Take 1 tablet (325 mg total) by mouth daily., Disp: , Rfl:    clopidogrel (PLAVIX) 75 MG tablet, Take 1 tablet (75 mg total) by mouth daily., Disp: 90 tablet, Rfl: 3   Iron-Vitamin C 65-125 MG TABS, Take 1 tablet by mouth daily., Disp: , Rfl:    metoprolol tartrate (LOPRESSOR) 25 MG tablet, Take 0.5 tablets (12.5 mg total) by mouth 2 (two) times daily., Disp: 90 tablet, Rfl: 3   Multiple Vitamin (MULTIVITAMIN) capsule, Take 1 capsule by mouth daily., Disp: , Rfl:    traMADol (ULTRAM) 50 MG tablet, Take 1 tablet (50 mg total) by mouth every 6 (six) hours as needed for moderate pain (pain score 4-6). 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   Spontaneous dissection of coronary artery     Tobacco Use: Social History   Tobacco Use  Smoking Status Never  Smokeless Tobacco Never    Labs: Review Flowsheet       Latest Ref Rng & Units 12/05/2023  Labs for ITP Cardiac and Pulmonary Rehab  Cholestrol 0 - 200 mg/dL 782   LDL (calc) 0 - 99 mg/dL 90   HDL-C >95 mg/dL 52   Trlycerides  <621 mg/dL 50   Hemoglobin H0Q 4.8 - 5.6 % 5.3   PH, Arterial 7.35 - 7.45 7.283  7.280  7.363  7.396  7.371  7.355  7.427   PCO2 arterial 32 - 48 mmHg 46.5  45.3  35.3  36.6  32.9  41.5  32.7   Bicarbonate 20.0 - 28.0 mmol/L 22.1  21.4  20.4  22.5  22.2  19.1  23.2  21.6   TCO2 22 - 32 mmol/L 23  23  22  23  24  25  23  20  24  27  24  23    Acid-base deficit 0.0 - 2.0 mmol/L 5.0  5.0  5.0  2.0  3.0  6.0  2.0  2.0   O2 Saturation % 98  99  100  100  76  100  100  99     Details       Multiple values from one day are sorted in reverse-chronological order          Exercise Target Goals: Exercise Program Goal: Individual exercise prescription set using results from initial 6 min walk test and THRR while considering  patient's activity barriers and safety.   Exercise Prescription Goal: Initial exercise prescription builds to 30-45 minutes a day of aerobic activity, 2-3 days per week.  Home  exercise guidelines will be given to patient during program as part of exercise prescription that the participant will acknowledge.   Education: Aerobic Exercise: - Group verbal and visual presentation on the components of exercise prescription. Introduces F.I.T.T principle from ACSM for exercise prescriptions.  Reviews F.I.T.T. principles of aerobic exercise including progression. Written material given at graduation.   Education: Resistance Exercise: - Group verbal and visual presentation on the components of exercise prescription. Introduces F.I.T.T principle from ACSM for exercise prescriptions  Reviews F.I.T.T. principles of resistance exercise including progression. Written material given at graduation.    Education: Exercise & Equipment Safety: - Individual verbal instruction and demonstration of equipment use and safety with use of the equipment. Flowsheet Row Cardiac Rehab from 01/27/2024 in [facility] Cardiac and Pulmonary Rehab  Date 01/27/24  Educator NT  Instruction Review Code 1-  Verbalizes Understanding       Education: Exercise Physiology & General Exercise Guidelines: - Group verbal and written instruction with models to review the exercise physiology of the cardiovascular system and associated critical values. Provides general exercise guidelines with specific guidelines to those with heart or lung disease.    Education: Flexibility, Balance, Mind/Body Relaxation: - Group verbal and visual presentation with interactive activity on the components of exercise prescription. Introduces F.I.T.T principle from ACSM for exercise prescriptions. Reviews F.I.T.T. principles of flexibility and balance exercise training including progression. Also discusses the mind body connection.  Reviews various relaxation techniques to help reduce and manage stress (i.e. Deep breathing, progressive muscle relaxation, and visualization). Balance handout provided to take home. Written material given at graduation.   Activity Barriers & Risk Stratification:  Activity Barriers & Cardiac Risk Stratification - 01/26/24 1403       Activity Barriers & Cardiac Risk Stratification   Activity Barriers None    Cardiac Risk Stratification High             6 Minute Walk:  6 Minute Walk     Row Name 01/27/24 0923         6 Minute Walk   Phase Initial     Distance 1455 feet     Walk Time 6 minutes     # of Rest Breaks 0     MPH 2.76     METS 4.37     RPE 7     Perceived Dyspnea  0     VO2 Peak 15.31     Symptoms No     Resting HR 71 bpm     Resting BP 102/64     Resting Oxygen Saturation  97 %     Exercise Oxygen Saturation  during 6 min walk 96 %     Max Ex. HR 82 bpm     Max Ex. BP 114/66     2 Minute Post BP 104/62              Oxygen Initial Assessment:   Oxygen Re-Evaluation:   Oxygen Discharge (Final Oxygen Re-Evaluation):   Initial Exercise Prescription:  Initial Exercise Prescription - 01/27/24 0900       Date of Initial Exercise RX and Referring  Provider   Date 01/27/24    Referring Provider Dr. Jodelle Red, MD      Oxygen   Maintain Oxygen Saturation 88% or higher      Treadmill   MPH 3    Grade 1    Minutes 15    METs 3.71      Elliptical   Level 1  Speed 3    Minutes 15    METs 4.37      REL-XR   Level 3    Speed 50    Minutes 15    METs 4.37      T5 Nustep   Level 3    SPM 80    Minutes 15    METs 4.37      Prescription Details   Frequency (times per week) 3    Duration Progress to 30 minutes of continuous aerobic without signs/symptoms of physical distress      Intensity   THRR 40-80% of Max Heartrate 112-153    Ratings of Perceived Exertion 11-13    Perceived Dyspnea 0-4      Progression   Progression Continue to progress workloads to maintain intensity without signs/symptoms of physical distress.      Resistance Training   Training Prescription Yes    Weight 7 lb    Reps 10-15             Perform Capillary Blood Glucose checks as needed.  Exercise Prescription Changes:   Exercise Prescription Changes     Row Name 01/27/24 0900 02/08/24 1600           Response to Exercise   Blood Pressure (Admit) 102/64 102/58      Blood Pressure (Exercise) 114/66 162/76      Blood Pressure (Exit) 104/62 96/58      Heart Rate (Admit) 71 bpm 96 bpm      Heart Rate (Exercise) 82 bpm 132 bpm      Heart Rate (Exit) 72 bpm 95 bpm      Oxygen Saturation (Admit) 97 % --      Oxygen Saturation (Exercise) 96 % --      Rating of Perceived Exertion (Exercise) 7 14      Perceived Dyspnea (Exercise) 0 --      Symptoms none none      Comments Results first few exercise sessions      Duration -- Continue with 30 min of aerobic exercise without signs/symptoms of physical distress.      Intensity -- THRR unchanged        Progression   Progression -- Continue to progress workloads to maintain intensity without signs/symptoms of physical distress.      Average METs -- 3.6         Resistance Training   Training Prescription -- Yes      Weight -- 7 lb      Reps -- 10-15        Interval Training   Interval Training -- No        Treadmill   MPH -- 3      Grade -- 5      Minutes -- 15      METs -- 5.37        Elliptical   Level -- 1      Speed -- 3      Minutes -- 15      METs -- 3               Exercise Comments:   Exercise Comments     Row Name 02/01/24 0745           Exercise Comments First full day of exercise!  Patient was oriented to gym and equipment including functions, settings, policies, and procedures.  Patient's individual exercise prescription and treatment plan were reviewed.  All starting workloads were  established based on the results of the 6 minute walk test done at initial orientation visit.  The plan for exercise progression was also introduced and progression will be customized based on patient's performance and goals.                Exercise Goals and Review:   Exercise Goals     Row Name 01/27/24 1610             Exercise Goals   Increase Physical Activity Yes       Intervention Develop an individualized exercise prescription for aerobic and resistive training based on initial evaluation findings, risk stratification, comorbidities and participant's personal goals.;Provide advice, education, support and counseling about physical activity/exercise needs.       Expected Outcomes Short Term: Attend rehab on a regular basis to increase amount of physical activity.;Long Term: Add in home exercise to make exercise part of routine and to increase amount of physical activity.;Long Term: Exercising regularly at least 3-5 days a week.       Increase Strength and Stamina Yes       Intervention Provide advice, education, support and counseling about physical activity/exercise needs.;Develop an individualized exercise prescription for aerobic and resistive training based on initial evaluation findings, risk stratification,  comorbidities and participant's personal goals.       Expected Outcomes Short Term: Increase workloads from initial exercise prescription for resistance, speed, and METs.;Short Term: Perform resistance training exercises routinely during rehab and add in resistance training at home;Long Term: Improve cardiorespiratory fitness, muscular endurance and strength as measured by increased METs and functional capacity ( )       Able to understand and use rate of perceived exertion (RPE) scale Yes       Intervention Provide education and explanation on how to use RPE scale       Expected Outcomes Short Term: Able to use RPE daily in rehab to express subjective intensity level;Long Term:  Able to use RPE to guide intensity level when exercising independently       Able to understand and use Dyspnea scale Yes       Intervention Provide education and explanation on how to use Dyspnea scale       Expected Outcomes Short Term: Able to use Dyspnea scale daily in rehab to express subjective sense of shortness of breath during exertion;Long Term: Able to use Dyspnea scale to guide intensity level when exercising independently       Knowledge and understanding of Target Heart Rate Range (THRR) Yes       Intervention Provide education and explanation of THRR including how the numbers were predicted and where they are located for reference       Expected Outcomes Short Term: Able to state/look up THRR;Long Term: Able to use THRR to govern intensity when exercising independently;Short Term: Able to use daily as guideline for intensity in rehab       Able to check pulse independently Yes       Intervention Provide education and demonstration on how to check pulse in carotid and radial arteries.;Review the importance of being able to check your own pulse for safety during independent exercise       Expected Outcomes Short Term: Able to explain why pulse checking is important during independent exercise;Long Term: Able to  check pulse independently and accurately       Understanding of Exercise Prescription Yes       Intervention Provide education, explanation, and written materials  on patient's individual exercise prescription       Expected Outcomes Short Term: Able to explain program exercise prescription;Long Term: Able to explain home exercise prescription to exercise independently                Exercise Goals Re-Evaluation :  Exercise Goals Re-Evaluation     Row Name 02/01/24 0745 02/08/24 1652           Exercise Goal Re-Evaluation   Exercise Goals Review Increase Physical Activity;Able to understand and use rate of perceived exertion (RPE) scale;Knowledge and understanding of Target Heart Rate Range (THRR);Understanding of Exercise Prescription;Increase Strength and Stamina;Able to check pulse independently;Able to understand and use Dyspnea scale Increase Physical Activity;Understanding of Exercise Prescription;Increase Strength and Stamina      Comments Reviewed RPE and dyspnea scale, THR and program prescription with pt today.  Pt voiced understanding and was given a copy of goals to take home. Baby is off to a good start in the program. She has completed her first 3 exercise sessions in this review. She has done a workload of 3 mph with an incline of 5% on the treadmill. She has done level 1 on the elliptical while maintaining a speed of . We will continue to monitor her progress in the program.      Expected Outcomes Short: Use RPE daily to regulate intensity. Long: Follow program prescription in THR. Short: Continue to follow current exercise prescription. Long: Continue exercise to improve strength and stamina.               Discharge Exercise Prescription (Final Exercise Prescription Changes):  Exercise Prescription Changes - 02/08/24 1600       Response to Exercise   Blood Pressure (Admit) 102/58    Blood Pressure (Exercise) 162/76    Blood Pressure (Exit) 96/58    Heart  Rate (Admit) 96 bpm    Heart Rate (Exercise) 132 bpm    Heart Rate (Exit) 95 bpm    Rating of Perceived Exertion (Exercise) 14    Symptoms none    Comments first few exercise sessions    Duration Continue with 30 min of aerobic exercise without signs/symptoms of physical distress.    Intensity THRR unchanged      Progression   Progression Continue to progress workloads to maintain intensity without signs/symptoms of physical distress.    Average METs 3.6      Resistance Training   Training Prescription Yes    Weight 7 lb    Reps 10-15      Interval Training   Interval Training No      Treadmill   MPH 3    Grade 5    Minutes 15    METs 5.37      Elliptical   Level 1    Speed 3    Minutes 15    METs 3             Nutrition:  Target Goals: Understanding of nutrition guidelines, daily intake of sodium 1500mg , cholesterol 200mg , calories 30% from fat and 7% or less from saturated fats, daily to have 5 or more servings of fruits and vegetables.  Education: All About Nutrition: -Group instruction provided by verbal, written material, interactive activities, discussions, models, and posters to present general guidelines for heart healthy nutrition including fat, fiber, MyPlate, the role of sodium in heart healthy nutrition, utilization of the nutrition label, and utilization of this knowledge for meal planning. Follow up email sent as well.  Written material given at graduation. Flowsheet Row Cardiac Rehab from 01/27/2024 in [facility] Cardiac and Pulmonary Rehab  Education need identified 01/27/24       Biometrics:  Pre Biometrics - 01/27/24 0924       Pre Biometrics   Height 5\' 4"  (1.626 m)    Weight 134 lb 9.6 oz (61.1 kg)    Waist Circumference 29 inches    Hip Circumference 35.5 inches    Waist to Hip Ratio 0.82 %    BMI (Calculated) 23.09    Single Leg Stand 30 seconds              Nutrition Therapy Plan and Nutrition Goals:  Nutrition Therapy & Goals -  01/27/24 0928       Nutrition Therapy   RD appointment deferred Yes      Intervention Plan   Intervention Prescribe, educate and counsel regarding individualized specific dietary modifications aiming towards targeted core components such as weight, hypertension, lipid management, diabetes, heart failure and other comorbidities.    Expected Outcomes Short Term Goal: Understand basic principles of dietary content, such as calories, fat, sodium, cholesterol and nutrients.;Short Term Goal: A plan has been developed with personal nutrition goals set during dietitian appointment.;Long Term Goal: Adherence to prescribed nutrition plan.             Nutrition Assessments:  MEDIFICTS Score Key: >=70 Need to make dietary changes  40-70 Heart Healthy Diet <= 40 Therapeutic Level Cholesterol Diet  Flowsheet Row Cardiac Rehab from 01/27/2024 in [facility] Cardiac and Pulmonary Rehab  Picture Your Plate Total Score on Admission 69      Picture Your Plate Scores: <16 Unhealthy dietary pattern with much room for improvement. 41-50 Dietary pattern unlikely to meet recommendations for good health and room for improvement. 51-60 More healthful dietary pattern, with some room for improvement.  >60 Healthy dietary pattern, although there may be some specific behaviors that could be improved.    Nutrition Goals Re-Evaluation:   Nutrition Goals Discharge (Final Nutrition Goals Re-Evaluation):   Psychosocial: Target Goals: Acknowledge presence or absence of significant depression and/or stress, maximize coping skills, provide positive support system. Participant is able to verbalize types and ability to use techniques and skills needed for reducing stress and depression.   Education: Stress, Anxiety, and Depression - Group verbal and visual presentation to define topics covered.  Reviews how body is impacted by stress, anxiety, and depression.  Also discusses healthy ways to reduce stress and to  treat/manage anxiety and depression.  Written material given at graduation.   Education: Sleep Hygiene -Provides group verbal and written instruction about how sleep can affect your health.  Define sleep hygiene, discuss sleep cycles and impact of sleep habits. Review good sleep hygiene tips.    Initial Review & Psychosocial Screening:  Initial Psych Review & Screening - 01/26/24 1406       Initial Review   Current issues with Current Sleep Concerns      Family Dynamics   Good Support System? Yes   boyfriend, mom     Barriers   Psychosocial barriers to participate in program There are no identifiable barriers or psychosocial needs.      Screening Interventions   Interventions Encouraged to exercise;Provide feedback about the scores to participant;To provide support and resources with identified psychosocial needs    Expected Outcomes Short Term goal: Utilizing psychosocial counselor, staff and physician to assist with identification of specific Stressors or current issues interfering with healing process. Setting  desired goal for each stressor or current issue identified.;Long Term Goal: Stressors or current issues are controlled or eliminated.;Short Term goal: Identification and review with participant of any Quality of Life or Depression concerns found by scoring the questionnaire.;Long Term goal: The participant improves quality of Life and PHQ9 Scores as seen by post scores and/or verbalization of changes             Quality of Life Scores:   Quality of Life - 01/27/24 0928       Quality of Life   Select Quality of Life      Quality of Life Scores   Health/Function Pre 20.6 %    Socioeconomic Pre 15 %    Psych/Spiritual Pre 15 %    Family Pre 15 %    GLOBAL Pre 17.4 %            Scores of 19 and below usually indicate a poorer quality of life in these areas.  A difference of  2-3 points is a clinically meaningful difference.  A difference of 2-3 points in the  total score of the Quality of Life Index has been associated with significant improvement in overall quality of life, self-image, physical symptoms, and general health in studies assessing change in quality of life.  PHQ-9: Review Flowsheet       01/27/2024  Depression screen PHQ 2/9  Decreased Interest 0  Down, Depressed, Hopeless 0  PHQ - 2 Score 0  Altered sleeping 1  Tired, decreased energy 1  Change in appetite 0  Feeling bad or failure about yourself  0  Trouble concentrating 0  Moving slowly or fidgety/restless 0  Suicidal thoughts 0  PHQ-9 Score 2  Difficult doing work/chores Not difficult at all   Interpretation of Total Score  Total Score Depression Severity:  1-4 = Minimal depression, 5-9 = Mild depression, 10-14 = Moderate depression, 15-19 = Moderately severe depression, 20-27 = Severe depression   Psychosocial Evaluation and Intervention:  Psychosocial Evaluation - 01/26/24 1415       Psychosocial Evaluation & Interventions   Interventions Encouraged to exercise with the program and follow exercise prescription    Comments Benicia is coming to cardiac rehab post CABG x1. She states she feels like she is improving daily. Sleep is still a struggle since it is difficult to get comfortable in bed post op, but she notices it is getting slightly better than when she first came home. She can rely on her boyfriend and mom as a support system. She plans to return to work in March. She wants to come to the program to gain confidence in exercising post surgery    Expected Outcomes Short: attend cardiac rehab for education and exercise. Long: develop and maintain positive self care habtis    Continue Psychosocial Services  Follow up required by staff             Psychosocial Re-Evaluation:   Psychosocial Discharge (Final Psychosocial Re-Evaluation):   Vocational Rehabilitation: Provide vocational rehab assistance to qualifying candidates.   Vocational Rehab  Evaluation & Intervention:  Vocational Rehab - 01/26/24 1406       Initial Vocational Rehab Evaluation & Intervention   Assessment shows need for Vocational Rehabilitation No             Education: Education Goals: Education classes will be provided on a variety of topics geared toward better understanding of heart health and risk factor modification. Participant will state understanding/return demonstration of topics presented as  noted by education test scores.  Learning Barriers/Preferences:  Learning Barriers/Preferences - 01/26/24 1405       Learning Barriers/Preferences   Learning Barriers None    Learning Preferences None             General Cardiac Education Topics:  AED/CPR: - Group verbal and written instruction with the use of models to demonstrate the basic use of the AED with the basic ABC's of resuscitation.   Anatomy and Cardiac Procedures: - Group verbal and visual presentation and models provide information about basic cardiac anatomy and function. Reviews the testing methods done to diagnose heart disease and the outcomes of the test results. Describes the treatment choices: Medical Management, Angioplasty, or Coronary Bypass Surgery for treating various heart conditions including Myocardial Infarction, Angina, Valve Disease, and Cardiac Arrhythmias.  Written material given at graduation. Flowsheet Row Cardiac Rehab from 01/27/2024 in [facility] Cardiac and Pulmonary Rehab  Education need identified 01/27/24       Medication Safety: - Group verbal and visual instruction to review commonly prescribed medications for heart and lung disease. Reviews the medication, class of the drug, and side effects. Includes the steps to properly store meds and maintain the prescription regimen.  Written material given at graduation.   Intimacy: - Group verbal instruction through game format to discuss how heart and lung disease can affect sexual intimacy. Written material  given at graduation..   Know Your Numbers and Heart Failure: - Group verbal and visual instruction to discuss disease risk factors for cardiac and pulmonary disease and treatment options.  Reviews associated critical values for Overweight/Obesity, Hypertension, Cholesterol, and Diabetes.  Discusses basics of heart failure: signs/symptoms and treatments.  Introduces Heart Failure Zone chart for action plan for heart failure.  Written material given at graduation.   Infection Prevention: - Provides verbal and written material to individual with discussion of infection control including proper hand washing and proper equipment cleaning during exercise session. Flowsheet Row Cardiac Rehab from 01/27/2024 in [facility] Cardiac and Pulmonary Rehab  Date 01/27/24  Educator NT  Instruction Review Code 1- Verbalizes Understanding       Falls Prevention: - Provides verbal and written material to individual with discussion of falls prevention and safety. Flowsheet Row Cardiac Rehab from 01/27/2024 in [facility] Cardiac and Pulmonary Rehab  Date 01/27/24  Educator NT  Instruction Review Code 1- Verbalizes Understanding       Other: -Provides group and verbal instruction on various topics (see comments)   Knowledge Questionnaire Score:  Knowledge Questionnaire Score - 01/27/24 6045       Knowledge Questionnaire Score   Pre Score 23/26             Core Components/Risk Factors/Patient Goals at Admission:  Personal Goals and Risk Factors at Admission - 01/26/24 1405       Core Components/Risk Factors/Patient Goals on Admission   Heart Failure --    Intervention --    Expected Outcomes --             Education:Diabetes - Individual verbal and written instruction to review signs/symptoms of diabetes, desired ranges of glucose level fasting, after meals and with exercise. Acknowledge that pre and post exercise glucose checks will be done for 3 sessions at entry of program.   Core  Components/Risk Factors/Patient Goals Review:    Core Components/Risk Factors/Patient Goals at Discharge (Final Review):    ITP Comments:  ITP Comments     Row Name 01/26/24 1414 01/27/24 0922 02/01/24 0745 02/10/24  1144     ITP Comments Initial phone call completed. Diagnosis can be found in [facility] 12/6. EP Orientation scheduled for Wednesday 1/29 at 8am. Completed and gym orientation. Initial ITP created and sent for review to Dr. Bethann Punches, Medical Director. First full day of exercise!  Patient was oriented to gym and equipment including functions, settings, policies, and procedures.  Patient's individual exercise prescription and treatment plan were reviewed.  All starting workloads were established based on the results of the 6 minute walk test done at initial orientation visit.  The plan for exercise progression was also introduced and progression will be customized based on patient's performance and goals. 30 Day review completed. Medical Director ITP review done, changes made as directed, and signed approval by Medical Director.    new to program             Comments:

## 2024-02-12 DIAGNOSIS — Z951 Presence of aortocoronary bypass graft: Secondary | ICD-10-CM

## 2024-02-12 DIAGNOSIS — Z48812 Encounter for surgical aftercare following surgery on the circulatory system: Secondary | ICD-10-CM | POA: Diagnosis not present

## 2024-02-12 NOTE — Progress Notes (Signed)
Daily Session Note  Patient Details  Name: Traci May MRN: [id number] Date of Birth: 10/04/1976 Referring Provider:   Flowsheet Row Cardiac Rehab from 01/27/2024 in [facility] Cardiac and Pulmonary Rehab  Referring Provider Dr. Jodelle Red, MD       Encounter Date: 02/12/2024  Check In:  Session Check In - 02/12/24 0724       Check-In   Supervising physician immediately available to respond to emergencies See telemetry face sheet for immediately available ER MD    Location [facility]-Cardiac & Pulmonary Rehab    Staff Present Kelton Pillar RN,BSN,MPA;Noah Tickle, BS, Exercise Physiologist;Joseph Hollace Kinnier    Virtual Visit No    Medication changes reported     No    Fall or balance concerns reported    No    Warm-up and Cool-down Performed on first and last piece of equipment    Resistance Training Performed Yes    VAD Patient? No    PAD/SET Patient? No      Pain Assessment   Currently in Pain? No/denies                Social History   Tobacco Use  Smoking Status Never  Smokeless Tobacco Never    Goals Met:  Independence with exercise equipment Exercise tolerated well No report of concerns or symptoms today Strength training completed today  Goals Unmet:  Not Applicable  Comments: Pt able to follow exercise prescription today without complaint.  Will continue to monitor for progression.    Dr. Bethann Punches is Medical Director for [facility] Cardiac Rehabilitation.  Dr. Vida Rigger is Medical Director for [facility] Pulmonary Rehabilitation.

## 2024-02-15 ENCOUNTER — Encounter: Payer: Commercial Managed Care - PPO | Admitting: *Deleted

## 2024-02-15 DIAGNOSIS — Z951 Presence of aortocoronary bypass graft: Secondary | ICD-10-CM | POA: Diagnosis not present

## 2024-02-15 DIAGNOSIS — Z48812 Encounter for surgical aftercare following surgery on the circulatory system: Secondary | ICD-10-CM | POA: Diagnosis not present

## 2024-02-15 NOTE — Progress Notes (Signed)
Daily Session Note  Patient Details  Name: Traci May MRN: [id number] Date of Birth: May 06, 1976 Referring Provider:   Flowsheet Row Cardiac Rehab from 01/27/2024 in [facility] Cardiac and Pulmonary Rehab  Referring Provider Dr. Jodelle Red, MD       Encounter Date: 02/15/2024  Check In:  Session Check In - 02/15/24 0816       Check-In   Supervising physician immediately available to respond to emergencies See telemetry face sheet for immediately available ER MD    Location [facility]-Cardiac & Pulmonary Rehab    Staff Present Balinda Quails RDN,LDN;Joseph [facility] Madilyn Fireman BS, ACSM CEP, Exercise Physiologist;Tian Davison, RN, BSN, CCRP    Virtual Visit No    Medication changes reported     No    Fall or balance concerns reported    No    Warm-up and Cool-down Performed on first and last piece of equipment    Resistance Training Performed Yes    VAD Patient? No    PAD/SET Patient? No      Pain Assessment   Currently in Pain? No/denies                Social History   Tobacco Use  Smoking Status Never  Smokeless Tobacco Never    Goals Met:  Independence with exercise equipment Exercise tolerated well No report of concerns or symptoms today  Goals Unmet:  Not Applicable  Comments: Pt able to follow exercise prescription today without complaint.  Will continue to monitor for progression.    Dr. Bethann Punches is Medical Director for [facility] Cardiac Rehabilitation.  Dr. Vida Rigger is Medical Director for [facility] Pulmonary Rehabilitation.

## 2024-02-17 ENCOUNTER — Encounter: Payer: Commercial Managed Care - PPO | Admitting: *Deleted

## 2024-02-17 DIAGNOSIS — Z48812 Encounter for surgical aftercare following surgery on the circulatory system: Secondary | ICD-10-CM | POA: Diagnosis not present

## 2024-02-17 DIAGNOSIS — Z951 Presence of aortocoronary bypass graft: Secondary | ICD-10-CM | POA: Diagnosis not present

## 2024-02-17 NOTE — Progress Notes (Signed)
Daily Session Note  Patient Details  Name: NATALEAH SCIONEAUX MRN: [id number] Date of Birth: 10-15-1976 Referring Provider:   Flowsheet Row Cardiac Rehab from 01/27/2024 in [facility] Cardiac and Pulmonary Rehab  Referring Provider Dr. Jodelle Red, MD       Encounter Date: 02/17/2024  Check In:  Session Check In - 02/17/24 0823       Check-In   Supervising physician immediately available to respond to emergencies See telemetry face sheet for immediately available ER MD    Location [facility]-Cardiac & Pulmonary Rehab    Staff Present Cora Collum, RN, BSN, CCRP;Margaret Best, MS, Exercise Physiologist;Joseph Hood RCP,RRT,BSRT;Maxon Conetta BS, Exercise Physiologist    Virtual Visit No    Medication changes reported     No    Fall or balance concerns reported    No    Warm-up and Cool-down Performed on first and last piece of equipment    Resistance Training Performed Yes    VAD Patient? No    PAD/SET Patient? No      Pain Assessment   Currently in Pain? No/denies                Social History   Tobacco Use  Smoking Status Never  Smokeless Tobacco Never    Goals Met:  Independence with exercise equipment Exercise tolerated well No report of concerns or symptoms today  Goals Unmet:  Not Applicable  Comments: Pt able to follow exercise prescription today without complaint.  Will continue to monitor for progression.    Dr. Bethann Punches is Medical Director for [facility] Cardiac Rehabilitation.  Dr. Vida Rigger is Medical Director for [facility] Pulmonary Rehabilitation.

## 2024-02-22 ENCOUNTER — Encounter: Payer: Commercial Managed Care - PPO | Admitting: *Deleted

## 2024-02-22 DIAGNOSIS — Z951 Presence of aortocoronary bypass graft: Secondary | ICD-10-CM

## 2024-02-22 DIAGNOSIS — Z48812 Encounter for surgical aftercare following surgery on the circulatory system: Secondary | ICD-10-CM | POA: Diagnosis not present

## 2024-02-22 NOTE — Progress Notes (Signed)
 Daily Session Note  Patient Details  Name: Traci May MRN: [id number] Date of Birth: 1976/07/25 Referring Provider:   Flowsheet Row Cardiac Rehab from 01/27/2024 in [facility] Cardiac and Pulmonary Rehab  Referring Provider Dr. Jodelle Red, MD       Encounter Date: 02/22/2024  Check In:  Session Check In - 02/22/24 0758       Check-In   Supervising physician immediately available to respond to emergencies See telemetry face sheet for immediately available ER MD    Location [facility]-Cardiac & Pulmonary Rehab    Staff Present Cora Collum, RN, BSN, CCRP;Kelly Hayes BS, ACSM CEP, Exercise Physiologist;Joseph Hood RCP,RRT,BSRT    Virtual Visit No    Medication changes reported     No    Fall or balance concerns reported    No    Warm-up and Cool-down Performed on first and last piece of equipment    Resistance Training Performed Yes    VAD Patient? No    PAD/SET Patient? No      Pain Assessment   Currently in Pain? No/denies                Social History   Tobacco Use  Smoking Status Never  Smokeless Tobacco Never    Goals Met:  Independence with exercise equipment Exercise tolerated well Personal goals reviewed No report of concerns or symptoms today  Goals Unmet:  Not Applicable  Comments: Pt able to follow exercise prescription today without complaint.  Will continue to monitor for progression.   Reviewed home exercise with pt today.  Pt plans to graduate early from cardiac rehab and exercise at [employer] for exercise.  Reviewed THR, pulse, RPE, sign and symptoms, pulse oximetery and when to call 911 or MD.  Also discussed weather considerations and indoor options.  Pt voiced understanding.    Dr. Bethann Punches is Medical Director for [facility] Cardiac Rehabilitation.  Dr. Vida Rigger is Medical Director for [facility] Pulmonary Rehabilitation.

## 2024-02-24 ENCOUNTER — Encounter: Payer: Commercial Managed Care - PPO | Admitting: *Deleted

## 2024-02-24 DIAGNOSIS — Z951 Presence of aortocoronary bypass graft: Secondary | ICD-10-CM | POA: Diagnosis not present

## 2024-02-24 DIAGNOSIS — Z48812 Encounter for surgical aftercare following surgery on the circulatory system: Secondary | ICD-10-CM | POA: Diagnosis not present

## 2024-02-24 NOTE — Progress Notes (Signed)
 Daily Session Note  Patient Details  Name: Traci May MRN: [id number] Date of Birth: 10-10-1976 Referring Provider:   Flowsheet Row Cardiac Rehab from 01/27/2024 in [facility] Cardiac and Pulmonary Rehab  Referring Provider Dr. Jodelle Red, MD       Encounter Date: 02/24/2024  Check In:  Session Check In - 02/24/24 0751       Check-In   Supervising physician immediately available to respond to emergencies See telemetry face sheet for immediately available ER MD    Location [facility]-Cardiac & Pulmonary Rehab    Staff Present Cora Collum, RN, BSN, CCRP;Margaret Best, MS, Exercise Physiologist;Joseph Reino Kent RCP,RRT,BSRT    Virtual Visit No    Medication changes reported     No    Fall or balance concerns reported    No    Warm-up and Cool-down Performed on first and last piece of equipment    Resistance Training Performed Yes    VAD Patient? No    PAD/SET Patient? No      Pain Assessment   Currently in Pain? No/denies                Social History   Tobacco Use  Smoking Status Never  Smokeless Tobacco Never    Goals Met:  Independence with exercise equipment Exercise tolerated well No report of concerns or symptoms today  Goals Unmet:  Not Applicable  Comments: Pt able to follow exercise prescription today without complaint.  Will continue to monitor for progression.    Dr. Bethann Punches is Medical Director for [facility] Cardiac Rehabilitation.  Dr. Vida Rigger is Medical Director for [facility] Pulmonary Rehabilitation.

## 2024-02-26 ENCOUNTER — Encounter: Payer: Commercial Managed Care - PPO | Admitting: *Deleted

## 2024-02-26 VITALS — Ht 64.0 in | Wt 138.0 lb

## 2024-02-26 DIAGNOSIS — Z48812 Encounter for surgical aftercare following surgery on the circulatory system: Secondary | ICD-10-CM | POA: Diagnosis not present

## 2024-02-26 DIAGNOSIS — Z951 Presence of aortocoronary bypass graft: Secondary | ICD-10-CM

## 2024-02-26 NOTE — Patient Instructions (Addendum)
 Discharge Patient Instructions  Patient Details  Name: Traci May MRN: [id number] Date of Birth: 31-Mar-1976 Referring Provider:  No ref. provider found  Number of Visits: 12/36  Reason for Discharge:  Patient reached a stable level of exercise. Patient independent in their exercise. Patient has met program and personal goals. Early Exit:  Back to work Diagnosis:  S/P CABG x 1  Initial Exercise Prescription:  Initial Exercise Prescription - 01/27/24 0900       Date of Initial Exercise RX and Referring Provider   Date 01/27/24    Referring Provider Dr. Jodelle Red, MD      Oxygen   Maintain Oxygen Saturation 88% or higher      Treadmill   MPH 3    Grade 1    Minutes 15    METs 3.71      Elliptical   Level 1    Speed 3    Minutes 15    METs 4.37      REL-XR   Level 3    Speed 50    Minutes 15    METs 4.37      T5 Nustep   Level 3    SPM 80    Minutes 15    METs 4.37      Prescription Details   Frequency (times per week) 3    Duration Progress to 30 minutes of continuous aerobic without signs/symptoms of physical distress      Intensity   THRR 40-80% of Max Heartrate 112-153    Ratings of Perceived Exertion 11-13    Perceived Dyspnea 0-4      Progression   Progression Continue to progress workloads to maintain intensity without signs/symptoms of physical distress.      Resistance Training   Training Prescription Yes    Weight 7 lb    Reps 10-15            Discharge Exercise Prescription (Final Exercise Prescription Changes):  Exercise Prescription Changes - 02/22/24 0800       Response to Exercise   Duration Continue with 30 min of aerobic exercise without signs/symptoms of physical distress.    Intensity THRR unchanged      Progression   Progression Continue to progress workloads to maintain intensity without signs/symptoms of physical distress.    Average METs 3.6      Resistance Training   Training Prescription Yes     Weight 7 lb    Reps 10-15      Interval Training   Interval Training No      Treadmill   MPH 3    Grade 5    Minutes 15    METs 5.37      Elliptical   Level 1    Speed 3    Minutes 15    METs 3      Home Exercise Plan   Plans to continue exercise at [employer] (comment)   [employer]   Frequency Add 3 additional days to program exercise sessions.   3-4 days a week of home exericse upon early graduation from cardiac rehab.   Initial Home Exercises Provided 02/22/24            Functional Capacity:  6 Minute Walk     Row Name 01/27/24 0923 02/26/24 0747       6 Minute Walk   Phase Initial Discharge    Distance 1455 feet 1900 feet    Distance % Change -- 31 %  Distance Feet Change -- 445 ft    Walk Time 6 minutes 6 minutes    # of Rest Breaks 0 0    MPH 2.76 3.6    METS 4.37 5.68    RPE 7 13    Perceived Dyspnea  0 0    VO2 Peak 15.31 19.89    Symptoms No No    Resting HR 71 bpm 77 bpm    Resting BP 102/64 106/60    Resting Oxygen Saturation  97 % 97 %    Exercise Oxygen Saturation  during 6 min walk 96 % 98 %    Max Ex. HR 82 bpm 130 bpm    Max Ex. BP 114/66 130/58    2 Minute Post BP 104/62 --            Nutrition & Weight - Outcomes:  Pre Biometrics - 01/27/24 0924       Pre Biometrics   Height 5\' 4"  (1.626 m)    Weight 134 lb 9.6 oz (61.1 kg)    Waist Circumference 29 inches    Hip Circumference 35.5 inches    Waist to Hip Ratio 0.82 %    BMI (Calculated) 23.09    Single Leg Stand 30 seconds             Post Biometrics - 02/26/24 0749        Post  Biometrics   Height 5\' 4"  (1.626 m)    Weight 138 lb (62.6 kg)    Waist Circumference 29 inches    Hip Circumference 36 inches    Waist to Hip Ratio 0.81 %    BMI (Calculated) 23.68    Single Leg Stand 30 seconds

## 2024-02-26 NOTE — Progress Notes (Signed)
 Daily Session Note  Patient Details  Name: Traci May MRN: [id number] Date of Birth: Dec 28, 1976 Referring Provider:   Flowsheet Row Cardiac Rehab from 01/27/2024 in [facility] Cardiac and Pulmonary Rehab  Referring Provider Dr. Jodelle Red, MD       Encounter Date: 02/26/2024  Check In:  Session Check In - 02/26/24 0723       Check-In   Supervising physician immediately available to respond to emergencies See telemetry face sheet for immediately available ER MD    Location [facility]-Cardiac & Pulmonary Rehab    Staff Present Susann Givens RN,BSN;Joseph Reino Kent RCP,RRT,BSRT;Noah Newberry, [place], Exercise Physiologist    Virtual Visit No    Medication changes reported     No    Fall or balance concerns reported    No    Warm-up and Cool-down Performed on first and last piece of equipment    Resistance Training Performed Yes    VAD Patient? No    PAD/SET Patient? No      Pain Assessment   Currently in Pain? No/denies                Social History   Tobacco Use  Smoking Status Never  Smokeless Tobacco Never    Goals Met:  Independence with exercise equipment Exercise tolerated well No report of concerns or symptoms today Strength training completed today  Goals Unmet:  Not Applicable  Comments:   6 Minute Walk     Row Name 01/27/24 0923 02/26/24 0747       6 Minute Walk   Phase Initial Discharge    Distance 1455 feet 1900 feet    Distance % Change -- 31 %    Distance Feet Change -- 445 ft    Walk Time 6 minutes 6 minutes    # of Rest Breaks 0 0    MPH 2.76 3.6    METS 4.37 5.68    RPE 7 13    Perceived Dyspnea  0 0    VO2 Peak 15.31 19.89    Symptoms No No    Resting HR 71 bpm 77 bpm    Resting BP 102/64 106/60    Resting Oxygen Saturation  97 % 97 %    Exercise Oxygen Saturation  during 6 min walk 96 % 98 %    Max Ex. HR 82 bpm 130 bpm    Max Ex. BP 114/66 130/58    2 Minute Post BP 104/62 --            Pt able to follow exercise  prescription today without complaint.  Will continue to monitor for progression.    Dr. Bethann Punches is Medical Director for [facility] Cardiac Rehabilitation.  Dr. Vida Rigger is Medical Director for [facility] Pulmonary Rehabilitation.

## 2024-02-29 ENCOUNTER — Encounter: Payer: Commercial Managed Care - PPO | Attending: Cardiology | Admitting: *Deleted

## 2024-02-29 DIAGNOSIS — Z951 Presence of aortocoronary bypass graft: Secondary | ICD-10-CM | POA: Insufficient documentation

## 2024-02-29 NOTE — Progress Notes (Signed)
 Daily Session Note  Patient Details  Name: Traci May MRN: [id number] Date of Birth: [birth year] Referring Provider:   Flowsheet Row Cardiac Rehab from 01/27/2024 in [facility] Cardiac and Pulmonary Rehab  Referring Provider Dr. Jodelle Red, MD       Encounter Date: 02/29/2024  Check In:  Session Check In - 02/29/24 0756       Check-In   Supervising physician immediately available to respond to emergencies See telemetry face sheet for immediately available ER MD    Location [facility]-Cardiac & Pulmonary Rehab    Staff Present Susann Givens RN,BSN;Joseph [facility] Madilyn Fireman BS, ACSM CEP, Exercise Physiologist    Virtual Visit No    Medication changes reported     No    Fall or balance concerns reported    No    Warm-up and Cool-down Performed on first and last piece of equipment    Resistance Training Performed Yes    VAD Patient? No    PAD/SET Patient? No      Pain Assessment   Currently in Pain? No/denies                Social History   Tobacco Use  Smoking Status Never  Smokeless Tobacco Never    Goals Met:  Independence with exercise equipment Exercise tolerated well No report of concerns or symptoms today Strength training completed today  Goals Unmet:  Not Applicable  Comments:  Traci May graduated today from  rehab with 12 sessions completed.  Details of the patient's exercise prescription and what She needs to do in order to continue the prescription and progress were discussed with patient.  Patient was given a copy of prescription and goals.  Patient verbalized understanding. Traci May plans to continue to exercise by attending [employer].    Dr. Bethann Punches is Medical Director for [facility] Cardiac Rehabilitation.  Dr. Vida Rigger is Medical Director for [facility] Pulmonary Rehabilitation.

## 2024-02-29 NOTE — Progress Notes (Signed)
 Discharge Summary   Traci May DOB: [birth year]  Traci May graduated today from  rehab with 12 sessions completed.  Details of the patient's exercise prescription and what She needs to do in order to continue the prescription and progress were discussed with patient.  Patient was given a copy of prescription and goals.  Patient verbalized understanding. Traci May plans to continue to exercise by attending [employer].   6 Minute Walk     Row Name 01/27/24 0923 02/26/24 0747       6 Minute Walk   Phase Initial Discharge    Distance 1455 feet 1900 feet    Distance % Change -- 31 %    Distance Feet Change -- 445 ft    Walk Time 6 minutes 6 minutes    # of Rest Breaks 0 0    MPH 2.76 3.6    METS 4.37 5.68    RPE 7 13    Perceived Dyspnea  0 0    VO2 Peak 15.31 19.89    Symptoms No No    Resting HR 71 bpm 77 bpm    Resting BP 102/64 106/60    Resting Oxygen Saturation  97 % 97 %    Exercise Oxygen Saturation  during 6 min walk 96 % 98 %    Max Ex. HR 82 bpm 130 bpm    Max Ex. BP 114/66 130/58    2 Minute Post BP 104/62 --

## 2024-02-29 NOTE — Progress Notes (Signed)
 Cardiac Individual Treatment Plan  Patient Details  Name: Traci May MRN: [id number] Date of Birth: [birth year] Referring Provider:   Flowsheet Row Cardiac Rehab from 01/27/2024 in [facility] Cardiac and Pulmonary Rehab  Referring Provider Dr. Jodelle Red, MD       Initial Encounter Date:  Flowsheet Row Cardiac Rehab from 01/27/2024 in [facility] Cardiac and Pulmonary Rehab  Date 01/27/24       Visit Diagnosis: S/P CABG x 1  Patient's Home Medications on Admission:  Current Outpatient Medications:    acetaminophen (TYLENOL) 325 MG tablet, Take 2 tablets (650 mg total) by mouth every 6 (six) hours as needed for mild pain (pain score 1-3)., Disp: , Rfl:    aspirin EC 325 MG tablet, Take 1 tablet (325 mg total) by mouth daily., Disp: , Rfl:    clopidogrel (PLAVIX) 75 MG tablet, Take 1 tablet (75 mg total) by mouth daily., Disp: 90 tablet, Rfl: 3   Iron-Vitamin C 65-125 MG TABS, Take 1 tablet by mouth daily., Disp: , Rfl:    metoprolol tartrate (LOPRESSOR) 25 MG tablet, Take 0.5 tablets (12.5 mg total) by mouth 2 (two) times daily., Disp: 90 tablet, Rfl: 3   Multiple Vitamin (MULTIVITAMIN) capsule, Take 1 capsule by mouth daily., Disp: , Rfl:    traMADol (ULTRAM) 50 MG tablet, Take 1 tablet (50 mg total) by mouth every 6 (six) hours as needed for moderate pain (pain score 4-6). 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   Spontaneous dissection of coronary artery     Tobacco Use: Social History   Tobacco Use  Smoking Status Never  Smokeless Tobacco Never    Labs: Review Flowsheet       Latest Ref Rng & Units 12/05/2023  Labs for ITP Cardiac and Pulmonary Rehab  Cholestrol 0 - 200 mg/dL 829   LDL (calc) 0 - 99 mg/dL 90   HDL-C >56 mg/dL 52   Trlycerides  <213 mg/dL 50   Hemoglobin Y8M 4.8 - 5.6 % 5.3   PH, Arterial 7.35 - 7.45 7.283  7.280  7.363  7.396  7.371  7.355  7.427   PCO2 arterial 32 - 48 mmHg 46.5  45.3  35.3  36.6  32.9  41.5  32.7   Bicarbonate 20.0 - 28.0 mmol/L 22.1  21.4  20.4  22.5  22.2  19.1  23.2  21.6   TCO2 22 - 32 mmol/L 23  23  22  23  24  25  23  20  24  27  24  23    Acid-base deficit 0.0 - 2.0 mmol/L 5.0  5.0  5.0  2.0  3.0  6.0  2.0  2.0   O2 Saturation % 98  99  100  100  76  100  100  99     Details       Multiple values from one day are sorted in reverse-chronological order          Exercise Target Goals: Exercise Program Goal: Individual exercise prescription set using results from initial 6 min walk test and THRR while considering  patient's activity barriers and safety.   Exercise Prescription Goal: Initial exercise prescription builds to 30-45 minutes a day of aerobic activity, 2-3 days per week.  Home  exercise guidelines will be given to patient during program as part of exercise prescription that the participant will acknowledge.   Education: Aerobic Exercise: - Group verbal and visual presentation on the components of exercise prescription. Introduces F.I.T.T principle from ACSM for exercise prescriptions.  Reviews F.I.T.T. principles of aerobic exercise including progression. Written material given at graduation.   Education: Resistance Exercise: - Group verbal and visual presentation on the components of exercise prescription. Introduces F.I.T.T principle from ACSM for exercise prescriptions  Reviews F.I.T.T. principles of resistance exercise including progression. Written material given at graduation.    Education: Exercise & Equipment Safety: - Individual verbal instruction and demonstration of equipment use and safety with use of the equipment. Flowsheet Row Cardiac Rehab from 01/27/2024 in [facility] Cardiac and Pulmonary Rehab  Date 01/27/24  Educator NT  Instruction Review Code 1-  Verbalizes Understanding       Education: Exercise Physiology & General Exercise Guidelines: - Group verbal and written instruction with models to review the exercise physiology of the cardiovascular system and associated critical values. Provides general exercise guidelines with specific guidelines to those with heart or lung disease.    Education: Flexibility, Balance, Mind/Body Relaxation: - Group verbal and visual presentation with interactive activity on the components of exercise prescription. Introduces F.I.T.T principle from ACSM for exercise prescriptions. Reviews F.I.T.T. principles of flexibility and balance exercise training including progression. Also discusses the mind body connection.  Reviews various relaxation techniques to help reduce and manage stress (i.e. Deep breathing, progressive muscle relaxation, and visualization). Balance handout provided to take home. Written material given at graduation.   Activity Barriers & Risk Stratification:  Activity Barriers & Cardiac Risk Stratification - 01/26/24 1403       Activity Barriers & Cardiac Risk Stratification   Activity Barriers None    Cardiac Risk Stratification High             6 Minute Walk:  6 Minute Walk     Row Name 01/27/24 0923 02/26/24 0747       6 Minute Walk   Phase Initial Discharge    Distance 1455 feet 1900 feet    Distance % Change -- 31 %    Distance Feet Change -- 445 ft    Walk Time 6 minutes 6 minutes    # of Rest Breaks 0 0    MPH 2.76 3.6    METS 4.37 5.68    RPE 7 13    Perceived Dyspnea  0 0    VO2 Peak 15.31 19.89    Symptoms No No    Resting HR 71 bpm 77 bpm    Resting BP 102/64 106/60    Resting Oxygen Saturation  97 % 97 %    Exercise Oxygen Saturation  during 6 min walk 96 % 98 %    Max Ex. HR 82 bpm 130 bpm    Max Ex. BP 114/66 130/58    2 Minute Post BP 104/62 --             Oxygen Initial Assessment:   Oxygen Re-Evaluation:   Oxygen Discharge (Final  Oxygen Re-Evaluation):   Initial Exercise Prescription:  Initial Exercise Prescription - 01/27/24 0900       Date of Initial Exercise RX and Referring Provider   Date 01/27/24    Referring Provider Dr. Jodelle Red, MD      Oxygen   Maintain Oxygen Saturation 88% or higher      Treadmill   MPH 3  Grade 1    Minutes 15    METs 3.71      Elliptical   Level 1    Speed 3    Minutes 15    METs 4.37      REL-XR   Level 3    Speed 50    Minutes 15    METs 4.37      T5 Nustep   Level 3    SPM 80    Minutes 15    METs 4.37      Prescription Details   Frequency (times per week) 3    Duration Progress to 30 minutes of continuous aerobic without signs/symptoms of physical distress      Intensity   THRR 40-80% of Max Heartrate 112-153    Ratings of Perceived Exertion 11-13    Perceived Dyspnea 0-4      Progression   Progression Continue to progress workloads to maintain intensity without signs/symptoms of physical distress.      Resistance Training   Training Prescription Yes    Weight 7 lb    Reps 10-15             Perform Capillary Blood Glucose checks as needed.  Exercise Prescription Changes:   Exercise Prescription Changes     Row Name 01/27/24 0900 02/08/24 1600 02/22/24 0800         Response to Exercise   Blood Pressure (Admit) 102/64 102/58 --     Blood Pressure (Exercise) 114/66 162/76 --     Blood Pressure (Exit) 104/62 96/58 --     Heart Rate (Admit) 71 bpm 96 bpm --     Heart Rate (Exercise) 82 bpm 132 bpm --     Heart Rate (Exit) 72 bpm 95 bpm --     Oxygen Saturation (Admit) 97 % -- --     Oxygen Saturation (Exercise) 96 % -- --     Rating of Perceived Exertion (Exercise) 7 14 --     Perceived Dyspnea (Exercise) 0 -- --     Symptoms none none --     Comments Results first few exercise sessions --     Duration -- Continue with 30 min of aerobic exercise without signs/symptoms of physical distress. Continue with 30 min  of aerobic exercise without signs/symptoms of physical distress.     Intensity -- THRR unchanged THRR unchanged       Progression   Progression -- Continue to progress workloads to maintain intensity without signs/symptoms of physical distress. Continue to progress workloads to maintain intensity without signs/symptoms of physical distress.     Average METs -- 3.6 3.6       Resistance Training   Training Prescription -- Yes Yes     Weight -- 7 lb 7 lb     Reps -- 10-15 10-15       Interval Training   Interval Training -- No No       Treadmill   MPH -- 3 3     Grade -- 5 5     Minutes -- 15 15     METs -- 5.37 5.37       Elliptical   Level -- 1 1     Speed -- 3 3     Minutes -- 15 15     METs -- 3 3       Home Exercise Plan   Plans to continue exercise at -- -- [employer] (comment)  [employer]  Frequency -- -- Add 3 additional days to program exercise sessions.  3-4 days a week of home exericse upon early graduation from cardiac rehab.     Initial Home Exercises Provided -- -- 02/22/24              Exercise Comments:   Exercise Comments     Row Name 02/01/24 0745           Exercise Comments First full day of exercise!  Patient was oriented to gym and equipment including functions, settings, policies, and procedures.  Patient's individual exercise prescription and treatment plan were reviewed.  All starting workloads were established based on the results of the 6 minute walk test done at initial orientation visit.  The plan for exercise progression was also introduced and progression will be customized based on patient's performance and goals.                Exercise Goals and Review:   Exercise Goals     Row Name 01/27/24 1610             Exercise Goals   Increase Physical Activity Yes       Intervention Develop an individualized exercise prescription for aerobic and resistive training based on initial evaluation findings, risk  stratification, comorbidities and participant's personal goals.;Provide advice, education, support and counseling about physical activity/exercise needs.       Expected Outcomes Short Term: Attend rehab on a regular basis to increase amount of physical activity.;Long Term: Add in home exercise to make exercise part of routine and to increase amount of physical activity.;Long Term: Exercising regularly at least 3-5 days a week.       Increase Strength and Stamina Yes       Intervention Provide advice, education, support and counseling about physical activity/exercise needs.;Develop an individualized exercise prescription for aerobic and resistive training based on initial evaluation findings, risk stratification, comorbidities and participant's personal goals.       Expected Outcomes Short Term: Increase workloads from initial exercise prescription for resistance, speed, and METs.;Short Term: Perform resistance training exercises routinely during rehab and add in resistance training at home;Long Term: Improve cardiorespiratory fitness, muscular endurance and strength as measured by increased METs and functional capacity ( )       Able to understand and use rate of perceived exertion (RPE) scale Yes       Intervention Provide education and explanation on how to use RPE scale       Expected Outcomes Short Term: Able to use RPE daily in rehab to express subjective intensity level;Long Term:  Able to use RPE to guide intensity level when exercising independently       Able to understand and use Dyspnea scale Yes       Intervention Provide education and explanation on how to use Dyspnea scale       Expected Outcomes Short Term: Able to use Dyspnea scale daily in rehab to express subjective sense of shortness of breath during exertion;Long Term: Able to use Dyspnea scale to guide intensity level when exercising independently       Knowledge and understanding of Target Heart Rate Range (THRR) Yes        Intervention Provide education and explanation of THRR including how the numbers were predicted and where they are located for reference       Expected Outcomes Short Term: Able to state/look up THRR;Long Term: Able to use THRR to govern intensity when exercising independently;Short Term: Able to  use daily as guideline for intensity in rehab       Able to check pulse independently Yes       Intervention Provide education and demonstration on how to check pulse in carotid and radial arteries.;Review the importance of being able to check your own pulse for safety during independent exercise       Expected Outcomes Short Term: Able to explain why pulse checking is important during independent exercise;Long Term: Able to check pulse independently and accurately       Understanding of Exercise Prescription Yes       Intervention Provide education, explanation, and written materials on patient's individual exercise prescription       Expected Outcomes Short Term: Able to explain program exercise prescription;Long Term: Able to explain home exercise prescription to exercise independently                Exercise Goals Re-Evaluation :  Exercise Goals Re-Evaluation     Row Name 02/01/24 0745 02/08/24 1652 02/22/24 0810 02/24/24 0755       Exercise Goal Re-Evaluation   Exercise Goals Review Increase Physical Activity;Able to understand and use rate of perceived exertion (RPE) scale;Knowledge and understanding of Target Heart Rate Range (THRR);Understanding of Exercise Prescription;Increase Strength and Stamina;Able to check pulse independently;Able to understand and use Dyspnea scale Increase Physical Activity;Understanding of Exercise Prescription;Increase Strength and Stamina Increase Physical Activity;Increase Strength and Stamina;Able to understand and use Dyspnea scale;Able to understand and use rate of perceived exertion (RPE) scale;Knowledge and understanding of Target Heart Rate Range (THRR);Able  to check pulse independently;Understanding of Exercise Prescription Understanding of Exercise Prescription;Increase Strength and Stamina;Increase Physical Activity    Comments Reviewed RPE and dyspnea scale, THR and program prescription with pt today.  Pt voiced understanding and was given a copy of goals to take home. Traci May is off to a good start in the program. She has completed her first 3 exercise sessions in this review. She has done a workload of 3 mph with an incline of 5% on the treadmill. She has done level 1 on the elliptical while maintaining a speed of . We will continue to monitor her progress in the program. Reviewed home exercise with pt today.  Pt plans to graduate early from cardiac rehab and exercise at [employer] for exercise.  Reviewed THR, pulse, RPE, sign and symptoms, pulse oximetery and when to call 911 or MD.  Also discussed weather considerations and indoor options.  Pt voiced understanding. Traci May plans to go to [employer] three days a week when she graduates the program. She also does Yoga one day a week at home.    Expected Outcomes Short: Use RPE daily to regulate intensity. Long: Follow program prescription in THR. Short: Continue to follow current exercise prescription. Long: Continue exercise to improve strength and stamina. Short: graduate from cardiac rehab. (requested early graduation) Long: maintain independent exercise routine. Short: graduate [facility]. Long: Maintain exercise independently.             Discharge Exercise Prescription (Final Exercise Prescription Changes):  Exercise Prescription Changes - 02/22/24 0800       Response to Exercise   Duration Continue with 30 min of aerobic exercise without signs/symptoms of physical distress.    Intensity THRR unchanged      Progression   Progression Continue to progress workloads to maintain intensity without signs/symptoms of physical distress.    Average METs 3.6      Resistance Training    Training Prescription  Yes    Weight 7 lb    Reps 10-15      Interval Training   Interval Training No      Treadmill   MPH 3    Grade 5    Minutes 15    METs 5.37      Elliptical   Level 1    Speed 3    Minutes 15    METs 3      Home Exercise Plan   Plans to continue exercise at [employer] (comment)   [employer]   Frequency Add 3 additional days to program exercise sessions.   3-4 days a week of home exericse upon early graduation from cardiac rehab.   Initial Home Exercises Provided 02/22/24             Nutrition:  Target Goals: Understanding of nutrition guidelines, daily intake of sodium 1500mg , cholesterol 200mg , calories 30% from fat and 7% or less from saturated fats, daily to have 5 or more servings of fruits and vegetables.  Education: All About Nutrition: -Group instruction provided by verbal, written material, interactive activities, discussions, models, and posters to present general guidelines for heart healthy nutrition including fat, fiber, MyPlate, the role of sodium in heart healthy nutrition, utilization of the nutrition label, and utilization of this knowledge for meal planning. Follow up email sent as well. Written material given at graduation. Flowsheet Row Cardiac Rehab from 01/27/2024 in [facility] Cardiac and Pulmonary Rehab  Education need identified 01/27/24       Biometrics:  Pre Biometrics - 01/27/24 0924       Pre Biometrics   Height 5\' 4"  (1.626 m)    Weight 134 lb 9.6 oz (61.1 kg)    Waist Circumference 29 inches    Hip Circumference 35.5 inches    Waist to Hip Ratio 0.82 %    BMI (Calculated) 23.09    Single Leg Stand 30 seconds             Post Biometrics - 02/26/24 0749        Post  Biometrics   Height 5\' 4"  (1.626 m)    Weight 138 lb (62.6 kg)    Waist Circumference 29 inches    Hip Circumference 36 inches    Waist to Hip Ratio 0.81 %    BMI (Calculated) 23.68    Single Leg Stand 30 seconds              Nutrition Therapy Plan and Nutrition Goals:  Nutrition Therapy & Goals - 01/27/24 0928       Nutrition Therapy   RD appointment deferred Yes      Intervention Plan   Intervention Prescribe, educate and counsel regarding individualized specific dietary modifications aiming towards targeted core components such as weight, hypertension, lipid management, diabetes, heart failure and other comorbidities.    Expected Outcomes Short Term Goal: Understand basic principles of dietary content, such as calories, fat, sodium, cholesterol and nutrients.;Short Term Goal: A plan has been developed with personal nutrition goals set during dietitian appointment.;Long Term Goal: Adherence to prescribed nutrition plan.             Nutrition Assessments:  MEDIFICTS Score Key: >=70 Need to make dietary changes  40-70 Heart Healthy Diet <= 40 Therapeutic Level Cholesterol Diet  Flowsheet Row Cardiac Rehab from 02/29/2024 in [facility] Cardiac and Pulmonary Rehab  Picture Your Plate Total Score on Discharge 75      Picture Your Plate Scores: <81 Unhealthy  dietary pattern with much room for improvement. 41-50 Dietary pattern unlikely to meet recommendations for good health and room for improvement. 51-60 More healthful dietary pattern, with some room for improvement.  >60 Healthy dietary pattern, although there may be some specific behaviors that could be improved.    Nutrition Goals Re-Evaluation:  Nutrition Goals Re-Evaluation     Row Name 02/24/24 0757             Goals   Comment Traci May deferred RD appointment.                Nutrition Goals Discharge (Final Nutrition Goals Re-Evaluation):  Nutrition Goals Re-Evaluation - 02/24/24 0757       Goals   Comment Traci May deferred RD appointment.             Psychosocial: Target Goals: Acknowledge presence or absence of significant depression and/or stress, maximize coping skills, provide positive support system. Participant is  able to verbalize types and ability to use techniques and skills needed for reducing stress and depression.   Education: Stress, Anxiety, and Depression - Group verbal and visual presentation to define topics covered.  Reviews how body is impacted by stress, anxiety, and depression.  Also discusses healthy ways to reduce stress and to treat/manage anxiety and depression.  Written material given at graduation.   Education: Sleep Hygiene -Provides group verbal and written instruction about how sleep can affect your health.  Define sleep hygiene, discuss sleep cycles and impact of sleep habits. Review good sleep hygiene tips.    Initial Review & Psychosocial Screening:  Initial Psych Review & Screening - 01/26/24 1406       Initial Review   Current issues with Current Sleep Concerns      Family Dynamics   Good Support System? Yes   boyfriend, mom     Barriers   Psychosocial barriers to participate in program There are no identifiable barriers or psychosocial needs.      Screening Interventions   Interventions Encouraged to exercise;Provide feedback about the scores to participant;To provide support and resources with identified psychosocial needs    Expected Outcomes Short Term goal: Utilizing psychosocial counselor, staff and physician to assist with identification of specific Stressors or current issues interfering with healing process. Setting desired goal for each stressor or current issue identified.;Long Term Goal: Stressors or current issues are controlled or eliminated.;Short Term goal: Identification and review with participant of any Quality of Life or Depression concerns found by scoring the questionnaire.;Long Term goal: The participant improves quality of Life and PHQ9 Scores as seen by post scores and/or verbalization of changes             Quality of Life Scores:   Quality of Life - 02/29/24 0757       Quality of Life   Select Quality of Life      Quality of Life  Scores   Health/Function Pre 20.6 %    Health/Function Post 25.03 %    Health/Function % Change 21.5 %    Socioeconomic Pre 15 %    Socioeconomic Post 21.69 %    Socioeconomic % Change  44.6 %    Psych/Spiritual Pre 15 %    Family Pre 15 %    Family Post 25.2 %    Family % Change 68 %    GLOBAL Pre 17.4 %    GLOBAL Post 23.64 %    GLOBAL % Change 35.86 %  Scores of 19 and below usually indicate a poorer quality of life in these areas.  A difference of  2-3 points is a clinically meaningful difference.  A difference of 2-3 points in the total score of the Quality of Life Index has been associated with significant improvement in overall quality of life, self-image, physical symptoms, and general health in studies assessing change in quality of life.  PHQ-9: Review Flowsheet       02/29/2024 01/27/2024  Depression screen PHQ 2/9  Decreased Interest 0 0  Down, Depressed, Hopeless 0 0  PHQ - 2 Score 0 0  Altered sleeping 1 1  Tired, decreased energy 0 1  Change in appetite 0 0  Feeling bad or failure about yourself  0 0  Trouble concentrating 0 0  Moving slowly or fidgety/restless 0 0  Suicidal thoughts 0 0  PHQ-9 Score 1 2  Difficult doing work/chores Not difficult at all Not difficult at all   Interpretation of Total Score  Total Score Depression Severity:  1-4 = Minimal depression, 5-9 = Mild depression, 10-14 = Moderate depression, 15-19 = Moderately severe depression, 20-27 = Severe depression   Psychosocial Evaluation and Intervention:  Psychosocial Evaluation - 01/26/24 1415       Psychosocial Evaluation & Interventions   Interventions Encouraged to exercise with the program and follow exercise prescription    Comments Traci May is coming to cardiac rehab post CABG x1. She states she feels like she is improving daily. Sleep is still a struggle since it is difficult to get comfortable in bed post op, but she notices it is getting slightly better than when she  first came home. She can rely on her boyfriend and mom as a support system. She plans to return to work in March. She wants to come to the program to gain confidence in exercising post surgery    Expected Outcomes Short: attend cardiac rehab for education and exercise. Long: develop and maintain positive self care habtis    Continue Psychosocial Services  Follow up required by staff             Psychosocial Re-Evaluation:  Psychosocial Re-Evaluation     Row Name 02/24/24 [phone]             Psychosocial Re-Evaluation   Current issues with None Identified       Comments Traci May is a little anxious about going back to work. Her job is a little stressful at times. She does not take anything for her mood. She does not have any depression or anxiety.       Expected Outcomes Short: Continue to exercise regularly to support mental health and notify staff of any changes. Long: maintain mental health and well being through teaching of rehab or prescribed medications independently.       Interventions Encouraged to attend Cardiac Rehabilitation for the exercise       Continue Psychosocial Services  Follow up required by staff                Psychosocial Discharge (Final Psychosocial Re-Evaluation):  Psychosocial Re-Evaluation - 02/24/24 0757       Psychosocial Re-Evaluation   Current issues with None Identified    Comments Traci May is a little anxious about going back to work. Her job is a little stressful at times. She does not take anything for her mood. She does not have any depression or anxiety.    Expected Outcomes Short: Continue to exercise regularly to support mental health  and notify staff of any changes. Long: maintain mental health and well being through teaching of rehab or prescribed medications independently.    Interventions Encouraged to attend Cardiac Rehabilitation for the exercise    Continue Psychosocial Services  Follow up required by staff              Vocational Rehabilitation: Provide vocational rehab assistance to qualifying candidates.   Vocational Rehab Evaluation & Intervention:  Vocational Rehab - 01/26/24 1406       Initial Vocational Rehab Evaluation & Intervention   Assessment shows need for Vocational Rehabilitation No             Education: Education Goals: Education classes will be provided on a variety of topics geared toward better understanding of heart health and risk factor modification. Participant will state understanding/return demonstration of topics presented as noted by education test scores.  Learning Barriers/Preferences:  Learning Barriers/Preferences - 01/26/24 1405       Learning Barriers/Preferences   Learning Barriers None    Learning Preferences None             General Cardiac Education Topics:  AED/CPR: - Group verbal and written instruction with the use of models to demonstrate the basic use of the AED with the basic ABC's of resuscitation.   Anatomy and Cardiac Procedures: - Group verbal and visual presentation and models provide information about basic cardiac anatomy and function. Reviews the testing methods done to diagnose heart disease and the outcomes of the test results. Describes the treatment choices: Medical Management, Angioplasty, or Coronary Bypass Surgery for treating various heart conditions including Myocardial Infarction, Angina, Valve Disease, and Cardiac Arrhythmias.  Written material given at graduation. Flowsheet Row Cardiac Rehab from 01/27/2024 in [facility]  Education need identified 01/27/24       Medication Safety: - Group verbal and visual instruction to review commonly prescribed medications for heart and lung disease. Reviews the medication, class of the drug, and side effects. Includes the steps to properly store meds and maintain the prescription regimen.  Written material given at graduation.   Intimacy: - Group verbal  instruction through game format to discuss how heart and lung disease can affect sexual intimacy. Written material given at graduation..   Know Your Numbers and Heart Failure: - Group verbal and visual instruction to discuss disease risk factors for cardiac and pulmonary disease and treatment options.  Reviews associated critical values for Overweight/Obesity, Hypertension, Cholesterol, and Diabetes.  Discusses basics of heart failure: signs/symptoms and treatments.  Introduces Heart Failure Zone chart for action plan for heart failure.  Written material given at graduation.   Infection Prevention: - Provides verbal and written material to individual with discussion of infection control including proper hand washing and proper equipment cleaning during exercise session. Flowsheet Row Cardiac Rehab from 01/27/2024 in [facility]  Date 01/27/24  Educator NT  Instruction Review Code 1- Verbalizes Understanding       Falls Prevention: - Provides verbal and written material to individual with discussion of falls prevention and safety. Flowsheet Row Cardiac Rehab from 01/27/2024 in [facility] Cardiac [facility] Pul[facility] Rehab  Date 01/27/24  Educator NT  Instruction Review Code 1- Verbalizes Understanding       Other: -Provides group and verbal instruction on various topics (see comments)   Knowledge Questionnaire Score:  Knowledge Questionnaire Score - 02/29/24 0757       Knowledge Questionnaire Score   Post Score 25/26  Core Components/Risk Factors/Patient Goals at Admission:  Personal Goals and Risk Factors at Admission - 01/26/24 1405       Core Components/Risk Factors/Patient Goals on Admission   Heart Failure --    Intervention --    Expected Outcomes --             Education:Diabetes - Individual verbal and written instruction to review signs/symptoms of diabetes, desired ranges of glucose level fasting, after meals and with exercise.  Acknowledge that pre and post exercise glucose checks will be done for 3 sessions at entry of program.   Core Components/Risk Factors/Patient Goals Review:   Goals and Risk Factor Review     Row Name 02/24/24 0800             Core Components/Risk Factors/Patient Goals Review   Personal Goals Review Other       Review Traci May is graduating on Monday and states she has no questions about her medications, blood pressure, or weight.       Expected Outcomes Short: Graduate [facility]. Long: Maintain medications, weight and blood pressure independently.                Core Components/Risk Factors/Patient Goals at Discharge (Final Review):   Goals and Risk Factor Review - 02/24/24 0800       Core Components/Risk Factors/Patient Goals Review   Personal Goals Review Other    Review Traci May is graduating on Monday and states she has no questions about her medications, blood pressure, or weight.    Expected Outcomes Short: Graduate [facility]. Long: Maintain medications, weight and blood pressure independently.             ITP Comments:  ITP Comments     Row Name 01/26/24 1414 01/27/24 0922 02/01/24 0745 02/10/24 1144 02/29/24 0801   ITP Comments Initial phone call completed. Diagnosis can be found in [facility] 12/6. EP Orientation scheduled for Wednesday 1/29 at 8am. Completed and gym orientation. Initial ITP created and sent for review to Dr. Bethann Punches, Medical Director. First full day of exercise!  Patient was oriented to gym and equipment including functions, settings, policies, and procedures.  Patient's individual exercise prescription and treatment plan were reviewed.  All starting workloads were established based on the results of the 6 minute walk test done at initial orientation visit.  The plan for exercise progression was also introduced and progression will be customized based on patient's performance and goals. 30 Day review completed. Medical Director ITP review done,  changes made as directed, and signed approval by Medical Director.    new to program Traci May graduated today from  rehab with 12 sessions completed.  Details of the patient's exercise prescription and what She needs to do in order to continue the prescription and progress were discussed with patient.  Patient was given a copy of prescription and goals.  Patient verbalized understanding. Traci May plans to continue to exercise by attending [employer].            Comments: Discharge ITP

## 2024-03-03 ENCOUNTER — Ambulatory Visit: Payer: Commercial Managed Care - PPO | Attending: Internal Medicine | Admitting: Internal Medicine

## 2024-03-03 ENCOUNTER — Encounter: Payer: Self-pay | Admitting: Internal Medicine

## 2024-03-03 VITALS — BP 100/62 | HR 68 | Ht 64.5 in | Wt 139.1 lb

## 2024-03-03 DIAGNOSIS — I2542 Coronary artery dissection: Secondary | ICD-10-CM

## 2024-03-03 DIAGNOSIS — I252 Old myocardial infarction: Secondary | ICD-10-CM

## 2024-03-03 DIAGNOSIS — I773 Arterial fibromuscular dysplasia: Secondary | ICD-10-CM | POA: Diagnosis not present

## 2024-03-03 MED ORDER — ASPIRIN 81 MG PO TBEC: 81.0000 mg | DELAYED_RELEASE_TABLET | Freq: Every day | ORAL | Status: AC

## 2024-03-03 NOTE — Patient Instructions (Signed)
 Medication Instructions:  Your physician recommends the following medication changes.  DECREASE: Aspirin to 81 mg by mouth daily  *If you need a refill on your cardiac medications before your next appointment, please call your pharmacy*   Lab Work: Your provider would like for you to have following labs drawn today (BMP).     Testing/Procedures: CTA Angiography (CTA), Neck is a special type of CT scan that uses a computer to produce multi-dimensional views of major blood vessels throughout the body. In CT angiography, a contrast material is injected through an IV to help visualize the blood vessels   CT Angiography (CTA) chest/abd/pelvis, is a special type of CT scan that uses a computer to produce multi-dimensional views of major blood vessels throughout the body. In CT angiography, a contrast material is injected through an IV to help visualize the blood vessels   Follow-Up: At [facility], you and your health needs are our priority.  As part of our continuing mission to provide you with exceptional heart care, we have created designated Provider Care Teams.  These Care Teams include your primary Cardiologist (physician) and Advanced Practice Providers (APPs -  Physician Assistants and Nurse Practitioners) who all work together to provide you with the care you need, when you need it.  We recommend signing up for the patient portal called "MyChart".  Sign up information is provided on this After Visit Summary.  MyChart is used to connect with patients for Virtual Visits (Telemedicine).  Patients are able to view lab/test results, encounter notes, upcoming appointments, etc.  Non-urgent messages can be sent to your provider as well.   To learn more about what you can do with MyChart, go to [email or web address].    Your next appointment:   3 month(s)  Provider:   You may see Yvonne Kendall, MD or one of the following Advanced Practice Providers on your designated Care Team:    Nicolasa Ducking, NP Eula Listen, PA-C Cadence Fransico Michael, PA-C Charlsie Quest, NP Carlos Levering, NP

## 2024-03-03 NOTE — Progress Notes (Signed)
  Cardiology Office Note:  .   Date:  03/03/2024  ID:  Traci May, DOB 04-29-1976, MRN [id number] PCP: Aviva Kluver  [facility] Providers Cardiologist:  Yvonne Kendall, MD     History of Present Illness: Traci May   Traci May is a 48 y.o. 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  She was seen by Traci Shields, NP, in late December, at which time she noted some fatigue.  Today, Traci May reports that she is feeling fairly well.  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  She notes some chest wall soreness and sensitivity around her median sternotomy incision.  She has not had any anginal chest pain or shortness of breath, palpitations, lightheadedness, or edema.  She bruises more easily than usual but has not had any other issues with her medications.  ROS: See HPI  Studies Reviewed: Traci May   EKG Interpretation Date/Time:  Thursday March 03 2024 09:50:14 EST Ventricular Rate:  68 PR Interval:  160 QRS Duration:  78 QT Interval:  392 QTC Calculation: 416 R Axis:   74  Text Interpretation: Normal sinus rhythm Normal ECG When compared with ECG of 06-Dec-2023 07:06, Nonspecific ST abnormality is no longer Present Confirmed by Trisha Ken, Cristal Deer [phone]) on 03/03/2024 3:06:12 PM    Risk Assessment/Calculations:             Physical Exam:   VS:  BP 100/62 (BP Location: Left Arm, Patient Position: Sitting, Cuff Size: Normal)   Pulse 68   Ht 5' 4.5" (1.638 m)   Wt 139 lb 2 oz (63.1 kg)   LMP 02/09/2019 Comment: postmenopause  SpO2 98%   BMI 23.51 kg/m    Wt Readings from Last 3 Encounters:  03/03/24 139 lb 2 oz (63.1 kg)  02/26/24 138 lb (62.6 kg)  01/27/24 134 lb 9.6 oz (61.1 kg)    General:  NAD. Neck: No JVD or HJR. Lungs: Clear to auscultation bilaterally without wheezes or crackles. Heart: Regular rate and rhythm without  murmurs, rubs, or gallops. Abdomen: Soft, nontender, nondistended. Extremities: No lower extremity edema.  ASSESSMENT AND PLAN: .    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 Traci May has recovered well from her NSTEMI secondary to scad of the ostial LAD requiring urgent single-vessel CABG (LIMA-LAD) in 11/2023.  Given that she is now 3 months out from her surgery, we will reduce aspirin to 81 mg daily.  Continue aspirin and clopidogrel for 12 months from the time of MI as tolerated, as well as metoprolol tartrate.  Defer statin therapy given the lack of atherosclerotic ASCVD.  Importance of avoidance of hormone therapy was reinforced.  Given association between scad and fibromuscular dysplasia, we have agreed to obtain a CTA of the neck as well as chest, abdomen, and pelvis to assess for other vascular involvement.  Traci May has completed cardiac rehab and plans to continue exercising.  I have counseled her to avoid heavy lifting.    Dispo: Return to clinic in 3 months.  Signed, Yvonne Kendall, MD

## 2024-03-04 LAB — BASIC METABOLIC PANEL
BUN/Creatinine Ratio: 14 (ref 9–23)
BUN: 12 mg/dL (ref 6–24)
CO2: 23 mmol/L (ref 20–29)
Calcium: 9.5 mg/dL (ref 8.7–10.2)
Chloride: 103 mmol/L (ref 96–106)
Creatinine, Ser: 0.83 mg/dL (ref 0.57–1.00)
Glucose: 90 mg/dL (ref 70–99)
Potassium: 4.8 mmol/L (ref 3.5–5.2)
Sodium: 141 mmol/L (ref 134–144)
eGFR: 87 mL/min/{1.73_m2} (ref >59)

## 2024-03-23 ENCOUNTER — Ambulatory Visit
Admission: RE | Admit: 2024-03-23 | Discharge: 2024-03-23 | Disposition: A | Discharge status: Home / Self Care | Source: Ambulatory Visit | Attending: Internal Medicine | Admitting: Internal Medicine

## 2024-03-23 DIAGNOSIS — I252 Old myocardial infarction: Secondary | ICD-10-CM | POA: Insufficient documentation

## 2024-03-23 DIAGNOSIS — I2542 Coronary artery dissection: Secondary | ICD-10-CM | POA: Insufficient documentation

## 2024-03-23 DIAGNOSIS — R161 Splenomegaly, not elsewhere classified: Secondary | ICD-10-CM | POA: Diagnosis not present

## 2024-03-23 MED ORDER — IOHEXOL 350 MG/ML SOLN
100.0000 mL | Freq: Once | INTRAVENOUS | Status: AC | PRN
  Administered 2024-03-23: 100 mL via INTRAVENOUS

## 2024-04-22 ENCOUNTER — Ambulatory Visit: Payer: Commercial Managed Care - PPO | Admitting: Internal Medicine

## 2024-06-06 ENCOUNTER — Ambulatory Visit: Admitting: Family Medicine

## 2024-06-06 ENCOUNTER — Other Ambulatory Visit (HOSPITAL_COMMUNITY)
Admission: RE | Admit: 2024-06-06 | Discharge: 2024-06-06 | Disposition: A | Discharge status: Home / Self Care | Source: Ambulatory Visit | Attending: Physician Assistant | Admitting: Physician Assistant

## 2024-06-06 ENCOUNTER — Encounter: Payer: Self-pay | Admitting: Family Medicine

## 2024-06-06 VITALS — BP 109/68 | HR 76 | Ht 63.5 in | Wt 144.6 lb

## 2024-06-06 DIAGNOSIS — R5382 Chronic fatigue, unspecified: Secondary | ICD-10-CM | POA: Diagnosis not present

## 2024-06-06 DIAGNOSIS — Z114 Encounter for screening for human immunodeficiency virus [HIV]: Secondary | ICD-10-CM | POA: Diagnosis not present

## 2024-06-06 DIAGNOSIS — Z124 Encounter for screening for malignant neoplasm of cervix: Secondary | ICD-10-CM | POA: Diagnosis not present

## 2024-06-06 DIAGNOSIS — R87610 Atypical squamous cells of undetermined significance on cytologic smear of cervix (ASC-US): Secondary | ICD-10-CM | POA: Insufficient documentation

## 2024-06-06 DIAGNOSIS — Z0001 Encounter for general adult medical examination with abnormal findings: Secondary | ICD-10-CM | POA: Diagnosis not present

## 2024-06-06 DIAGNOSIS — R161 Splenomegaly, not elsewhere classified: Secondary | ICD-10-CM

## 2024-06-06 DIAGNOSIS — Z130 Encounter for screening for diseases of the blood and blood-forming organs and certain disorders involving the immune mechanism: Secondary | ICD-10-CM

## 2024-06-06 DIAGNOSIS — Z1329 Encounter for screening for other suspected endocrine disorder: Secondary | ICD-10-CM | POA: Diagnosis not present

## 2024-06-06 DIAGNOSIS — Z951 Presence of aortocoronary bypass graft: Secondary | ICD-10-CM | POA: Diagnosis not present

## 2024-06-06 DIAGNOSIS — Z0000 Encounter for general adult medical examination without abnormal findings: Secondary | ICD-10-CM | POA: Insufficient documentation

## 2024-06-06 DIAGNOSIS — Z1231 Encounter for screening mammogram for malignant neoplasm of breast: Secondary | ICD-10-CM

## 2024-06-06 DIAGNOSIS — N809 Endometriosis, unspecified: Secondary | ICD-10-CM | POA: Insufficient documentation

## 2024-06-06 DIAGNOSIS — R87619 Unspecified abnormal cytological findings in specimens from cervix uteri: Secondary | ICD-10-CM | POA: Insufficient documentation

## 2024-06-06 DIAGNOSIS — Z13228 Encounter for screening for other metabolic disorders: Secondary | ICD-10-CM | POA: Diagnosis not present

## 2024-06-06 NOTE — Progress Notes (Signed)
 New patient visit   Patient: Traci May   DOB: 03-14-1976   48 y.o. Female  MRN: [id number] Visit Date: 06/06/2024  Today's healthcare provider: Carlean Charter, DO   Chief Complaint  Patient presents with   Establish Care    Discuss CT scan 03/23/24 Decline tdap/Pneumococcal vaccines   Gynecologic Exam    Pap smear   Subjective    Traci May is a 48 y.o. female who presents today as a new patient to establish care.  Gynecologic Exam The patient's pertinent negatives include no pelvic pain or vaginal discharge. Pertinent negatives include no abdominal pain, back pain, chills, constipation, diarrhea, dysuria, fever, flank pain, headaches, hematuria, nausea, rash or sore throat.   HPI     Establish Care    Additional comments: Discuss CT scan 03/23/24 Decline tdap/Pneumococcal vaccines        Gynecologic Exam    Additional comments: Pap smear      Last edited by Selinda Dales, CMA on 06/06/2024  2:25 PM.        Traci May is a 48 year old female with coronary artery disease who presents with fatigue.  She has been experiencing persistent fatigue and a lack of desire to engage in activities she previously enjoyed since her CABG surgery in December 2024, approximately seven months ago. She feels consistently tired and mentally and physically drained, waking up tired each day.  Her 03/23/2024 CT chest/abdomen/pelvis revealed moderate splenomegaly. She has a history of elevated platelets around the time of her CABG surgery, which initially normalized but then became elevated again. No known prior issues with elevated platelets.  She experiences discomfort in her chest, describing it as a 'weird' feeling in the skin, scar, and tissue post-surgery.   She has numbness and tingling in her right arm, particularly when sleeping on her right side, which began after her surgery. Discomfort in her shoulder and collarbone when lying on her side has improved over  time.  She reports hair loss and 'bridges' on her nails, which she associates with the COVID vaccine. No history of thyroid  problems.  She is currently taking a daily baby aspirin , Plavix , an iron  vitamin C  tablet, and metoprolol  25 mg. She is not taking Tylenol , tramadol , or any cholesterol medications.  She has not been sexually active for a while and has been postmenopausal since age 5.   She reports a little sinus drainage over the past few days but denies any significant allergies or taking medication for it. She has a bump on her eye that has been present for a while, which her eye doctor is monitoring.      Past Medical History:  Diagnosis Date   Hx of CABG 12/05/2023   LIMA-LAD   NSTEMI (non-ST elevated myocardial infarction) (HCC) 12/04/2023   NSTEMI in setting of SCAD   Spontaneous dissection of coronary artery    Past Surgical History:  Procedure Laterality Date   BUNIONECTOMY Left 04/25/2020   Procedure: LAPIDUS TYPE LEFT;  Surgeon: Anell Baptist, DPM;  Location: [facility];  Service: Podiatry;  Laterality: Left;  general with local   BUNIONECTOMY Right 02/18/2023   Procedure: BUNIONECTOMY - LAPIDUS;  Surgeon: Anell Baptist, DPM;  Location: [facility];  Service: Podiatry;  Laterality: Right;   CORONARY ARTERY BYPASS GRAFT N/A 12/05/2023   Procedure: CORONARY ARTERY BYPASS GRAFTING (CABG) TIMES ONE, USING LEFT INTERNAL MAMMARY ARTERY;  Surgeon: Melene Sportsman, MD;  Location: [facility] OR;  Service: Open  Heart Surgery;  Laterality: N/A;   HALLUX VALGUS AKIN Left 04/25/2020   Procedure: HALLUX VALGUS AKIN;  Surgeon: Anell Baptist, DPM;  Location: [facility];  Service: Podiatry;  Laterality: Left;   LEFT HEART CATH AND CORONARY ANGIOGRAPHY N/A 12/04/2023   Procedure: LEFT HEART CATH AND CORONARY ANGIOGRAPHY;  Surgeon: Percival Brace, MD;  Location: [facility] INVASIVE CV LAB;  Service: Cardiovascular;  Laterality: N/A;   TEE WITHOUT CARDIOVERSION N/A  12/05/2023   Procedure: TRANSESOPHAGEAL ECHOCARDIOGRAM (TEE);  Surgeon: Melene Sportsman, MD;  Location: [facility] OR;  Service: Open Heart Surgery;  Laterality: N/A;   Family Status  Relation Name Status   Mother  Alive   Father  Deceased   MGM  (Not Specified)   MGF  (Not Specified)   Neg Hx  (Not Specified)  No partnership data on file   Family History  Problem Relation Age of Onset   Diabetes Mother    Hypertension Mother    Hyperlipidemia Mother    Dementia Maternal Grandmother    Alzheimer's disease Maternal Grandmother    Diabetes Maternal Grandfather    Hypertension Maternal Grandfather    Breast cancer Neg Hx    Social History   Socioeconomic History   Marital status: Single    Spouse name: Not on file   Number of children: Not on file   Years of education: Not on file   Highest education level: Associate degree: occupational, Scientist, product/process development, or vocational program  Occupational History   Not on file  Tobacco Use   Smoking status: Never   Smokeless tobacco: Never  Vaping Use   Vaping status: Never Used  Substance and Sexual Activity   Alcohol use: Yes    Comment: occasional   Drug use: Never   Sexual activity: Not on file  Other Topics Concern   Not on file  Social History Narrative   Not on file   Social Drivers of Health   Financial Resource Strain: Low Risk  (06/02/2024)   Overall Financial Resource Strain (CARDIA)    Difficulty of Paying Living Expenses: Not very hard  Food Insecurity: No Food Insecurity (06/02/2024)   Hunger Vital Sign    Worried About Running Out of Food in the Last Year: Never true    Ran Out of Food in the Last Year: Never true  Transportation Needs: No Transportation Needs (06/02/2024)   PRAPARE - Administrator, Civil Service (Medical): No    Lack of Transportation (Non-Medical): No  Physical Activity: Sufficiently Active (06/02/2024)   Exercise Vital Sign    Days of Exercise per Week: 3 days    Minutes of Exercise per Session: 50  min  Stress: Stress Concern Present (06/02/2024)   [employer] of Occupational Health - Occupational Stress Questionnaire    Feeling of Stress : To some extent  Social Connections: Unknown (06/02/2024)   Social Connection and Isolation Panel    Frequency of Communication with Friends and Family: Patient declined    Frequency of Social Gatherings with Friends and Family: Patient declined    Attends Religious Services: Patient declined    Database administrator or Organizations: Patient declined    Attends Engineer, structural: Not on file    Marital Status: Patient declined   Outpatient Medications Prior to Visit  Medication Sig   aspirin  EC 81 MG tablet Take 1 tablet (81 mg total) by mouth daily.   clopidogrel  (PLAVIX ) 75 MG tablet Take 1 tablet (75 mg total) by mouth  daily.   Iron -Vitamin C  65-125 MG TABS Take 1 tablet by mouth daily.   metoprolol  tartrate (LOPRESSOR ) 25 MG tablet Take 0.5 tablets (12.5 mg total) by mouth 2 (two) times daily.   Multiple Vitamin (MULTIVITAMIN) capsule Take 1 capsule by mouth daily.   [DISCONTINUED] acetaminophen  (TYLENOL ) 325 MG tablet Take 2 tablets (650 mg total) by mouth every 6 (six) hours as needed for mild pain (pain score 1-3). (Patient not taking: Reported on 03/03/2024)   [DISCONTINUED] traMADol  (ULTRAM ) 50 MG tablet Take 1 tablet (50 mg total) by mouth every 6 (six) hours as needed for moderate pain (pain score 4-6). (Patient not taking: Reported on 03/03/2024)   No facility-administered medications prior to visit.   No Known Allergies  Immunization History  Administered Date(s) Administered   Influenza,inj,quad, With Preservative 09/28/2020   PFIZER(Purple Top)SARS-COV-2 Vaccination 08/10/2020, 08/31/2020    Health Maintenance  Topic Date Due   Colonoscopy  Never done   COVID-19 Vaccine (3 - 2024-25 season) 09/28/2024 (Originally 08/30/2023)   DTaP/Tdap/Td (1 - Tdap) 06/06/2025 (Originally 01/29/1996)   Pneumococcal Vaccine 40-59  Years old (1 of 2 - PCV) 06/06/2025 (Originally 01/29/1996)   INFLUENZA VACCINE  07/29/2024   Cervical Cancer Screening (HPV/Pap Cotest)  06/06/2029   Hepatitis C Screening  Completed   HIV Screening  Completed   HPV VACCINES  Aged Out   Meningococcal B Vaccine  Aged Out    Patient Care Team: Zanae Kuehnle, Asencion Blacksmith, DO as PCP - General (Family Medicine) End, Veryl Gottron, MD as PCP - Cardiology (Cardiology)  Review of Systems  Constitutional:  Negative for chills, fatigue and fever.  HENT:  Negative for congestion, ear pain, rhinorrhea, sneezing and sore throat.   Eyes: Negative.  Negative for pain and redness.  Respiratory:  Negative for cough, shortness of breath and wheezing.   Cardiovascular:  Negative for chest pain and leg swelling.  Gastrointestinal:  Negative for abdominal pain, blood in stool, constipation, diarrhea and nausea.  Endocrine: Negative for polydipsia and polyphagia.  Genitourinary: Negative.  Negative for dysuria, flank pain, hematuria, pelvic pain, vaginal bleeding and vaginal discharge.  Musculoskeletal:  Negative for arthralgias, back pain, gait problem and joint swelling.  Skin:  Negative for rash.  Neurological: Negative.  Negative for dizziness, tremors, seizures, weakness, light-headedness, numbness and headaches.  Hematological:  Negative for adenopathy.  Psychiatric/Behavioral: Negative.  Negative for behavioral problems, confusion and dysphoric mood. The patient is not nervous/anxious and is not hyperactive.         Objective    BP 109/68 (BP Location: Right Arm, Patient Position: Sitting, Cuff Size: Normal)   Pulse 76   Ht 5' 3.5 (1.613 m)   Wt 144 lb 9.6 oz (65.6 kg)   LMP 02/09/2019 Comment: postmenopause  SpO2 99%   BMI 25.21 kg/m     Physical Exam Vitals and nursing note reviewed.  Constitutional:      General: She is awake.     Appearance: Normal appearance.  HENT:     Head: Normocephalic and atraumatic.     Right Ear: Tympanic  membrane, ear canal and external ear normal.     Left Ear: Tympanic membrane, ear canal and external ear normal.     Nose: Nose normal.     Mouth/Throat:     Mouth: Mucous membranes are moist.     Pharynx: Oropharynx is clear. No oropharyngeal exudate or posterior oropharyngeal erythema.   Eyes:     General: No scleral icterus.    Extraocular Movements: Extraocular  movements intact.     Conjunctiva/sclera: Conjunctivae normal.     Pupils: Pupils are equal, round, and reactive to light.      Comments: Small bump on eye as denoted in red; following with ophthalmology.  Neck:     Thyroid : No thyromegaly or thyroid  tenderness.   Cardiovascular:     Rate and Rhythm: Normal rate and regular rhythm.     Pulses: Normal pulses.     Heart sounds: Normal heart sounds.  Pulmonary:     Effort: Pulmonary effort is normal. No tachypnea, bradypnea or respiratory distress.     Breath sounds: Normal breath sounds. No stridor. No wheezing, rhonchi or rales.  Chest:     Chest wall: Tenderness (along scar) present. No mass, lacerations, deformity, swelling, crepitus or edema.  Breasts:    Tanner Score is 5.     Right: Normal.     Left: Normal.   Abdominal:     General: Bowel sounds are normal. There is no distension.     Palpations: Abdomen is soft. There is no mass.     Tenderness: There is no abdominal tenderness. There is no guarding.     Hernia: No hernia is present.  Genitourinary:    Exam position: Lithotomy position.     Labia:        Right: No rash, tenderness, lesion or injury.        Left: No rash, tenderness, lesion or injury.      Urethra: No prolapse.     Vagina: Normal.     Cervix: Normal.     Uterus: Normal.      Adnexa: Right adnexa normal.   Musculoskeletal:     Cervical back: Normal range of motion and neck supple.     Right lower leg: No edema.     Left lower leg: No edema.  Lymphadenopathy:     Cervical: No cervical adenopathy.     Upper Body:     Right upper  body: No supraclavicular, axillary or pectoral adenopathy.     Left upper body: No supraclavicular, axillary or pectoral adenopathy.   Skin:    General: Skin is warm and dry.     Comments: Vertical well-healed scar to mid-chest noted, with some tenderness during palpation in the region for breast exam.   Neurological:     Mental Status: She is alert and oriented to person, place, and time. Mental status is at baseline.   Psychiatric:        Mood and Affect: Mood normal.        Behavior: Behavior normal.     Depression Screen    06/06/2024    2:17 PM 02/29/2024    7:58 AM 01/27/2024    9:27 AM  PHQ 2/9 Scores  PHQ - 2 Score 2 0 0  PHQ- 9 Score 7 1 2    Results for orders placed or performed in visit on 06/06/24  Comprehensive metabolic panel with GFR  Result Value Ref Range   Glucose 88 70 - 99 mg/dL   BUN 17 6 - 24 mg/dL   Creatinine, Ser 1.61 0.57 - 1.00 mg/dL   eGFR 82 >09 UE/AVW/0.98   BUN/Creatinine Ratio 19 9 - 23   Sodium 139 134 - 144 mmol/L   Potassium 4.6 3.5 - 5.2 mmol/L   Chloride 102 96 - 106 mmol/L   CO2 22 20 - 29 mmol/L   Calcium 9.8 8.7 - 10.2 mg/dL   Total Protein 7.0 6.0 - 8.5  g/dL   Albumin  5.1 (H) 3.9 - 4.9 g/dL   Globulin, Total 1.9 1.5 - 4.5 g/dL   Bilirubin Total 0.2 0.0 - 1.2 mg/dL   Alkaline Phosphatase 81 44 - 121 IU/L   AST 33 0 - 40 IU/L   ALT 45 (H) 0 - 32 IU/L  HIV Antibody (routine testing w rflx)  Result Value Ref Range   HIV Screen 4th Generation wRfx Non Reactive Non Reactive  Vitamin B12  Result Value Ref Range   Vitamin B-12 837 232 - 1,245 pg/mL  CBC with Differential/Platelet  Result Value Ref Range   WBC 6.8 3.4 - 10.8 x10E3/uL   RBC 4.62 3.77 - 5.28 x10E6/uL   Hemoglobin 14.8 11.1 - 15.9 g/dL   Hematocrit 16.1 09.6 - 46.6 %   MCV 95 79 - 97 fL   MCH 32.0 26.6 - 33.0 pg   MCHC 33.8 31.5 - 35.7 g/dL   RDW 04.5 40.9 - 81.1 %   Platelets 449 150 - 450 x10E3/uL   Neutrophils 66 Not Estab. %   Lymphs 24 Not Estab. %    Monocytes 7 Not Estab. %   Eos 2 Not Estab. %   Basos 1 Not Estab. %   Neutrophils Absolute 4.5 1.4 - 7.0 x10E3/uL   Lymphocytes Absolute 1.6 0.7 - 3.1 x10E3/uL   Monocytes Absolute 0.5 0.1 - 0.9 x10E3/uL   EOS (ABSOLUTE) 0.2 0.0 - 0.4 x10E3/uL   Basophils Absolute 0.0 0.0 - 0.2 x10E3/uL   Immature Granulocytes 0 Not Estab. %   Immature Grans (Abs) 0.0 0.0 - 0.1 x10E3/uL  VITAMIN D  25 Hydroxy (Vit-D Deficiency, Fractures)  Result Value Ref Range   Vit D, 25-Hydroxy 47.3 30.0 - 100.0 ng/mL  TSH Rfx on Abnormal to Free T4  Result Value Ref Range   TSH 2.660 0.450 - 4.500 uIU/mL  Hepatitis B surface antibody,qualitative  Result Value Ref Range   Hep B Surface Ab, Qual Reactive   Hepatitis B surface antigen  Result Value Ref Range   Hepatitis B Surface Ag Negative Negative  HCV Ab w Reflex to Quant PCR  Result Value Ref Range   HCV Ab Non Reactive Non Reactive  Hepatitis A antibody, total  Result Value Ref Range   hep A Total Ab Positive (A) Negative  Hepatitis A antibody, IgM  Result Value Ref Range   Hep A IgM Negative Negative  Interpretation:  Result Value Ref Range   HCV Interp 1: Comment   Cytology - PAP  Result Value Ref Range   High risk HPV Negative    Adequacy      UNSATISFACTORY for evaluation due to extremely scant cellularity. The   Adequacy      specimen is processed and examined microscopically, but is found to be   Adequacy      unsatisfactory for evaluation of an epithelial abnormality. Repeat study   Adequacy recommended.    Diagnosis - Non-diagnostic (A)    Comment Normal Reference Range HPV - Negative     Assessment & Plan     Encounter for medical examination to establish care  Splenomegaly -     CBC with Differential/Platelet -     Hepatitis B surface antibody,qualitative -     Hepatitis B surface antigen -     HCV Ab w Reflex to Quant PCR -     Hepatitis A antibody, total -     Hepatitis A antibody, IgM -     Interpretation:  History  of  coronary artery bypass graft x 1  Chronic fatigue -     Vitamin B12 -     VITAMIN D  25 Hydroxy (Vit-D Deficiency, Fractures) -     TSH Rfx on Abnormal to Free T4  Pap smear for cervical cancer screening -     Cytology - PAP  Atypical squamous cells of undetermined significance on cytologic smear of cervix (ASC-US ) -     Cytology - PAP  Screening for endocrine, metabolic and immunity disorder -     Comprehensive metabolic panel with GFR  Encounter for screening for HIV -     HIV Antibody (routine testing w rflx)  Encounter for screening mammogram for breast cancer -     3D Screening Mammogram, Left and Right; Future      Encounter for medical examination to establish care Physical exam overall unremarkable except as noted above. Routine lab work ordered as noted. Due for mammogram. Postmenopausal with no current vaginal issues. Discussed allergy management options. - Order mammogram. - Discuss options for allergy management with daily antihistamines like Claritin, Zyrtec, Allegra, or Xyzal if needed.  Splenomegaly Enlarged spleen on CT scan. No history of blood cell issues but did experience thrombocytopenia while hospitalized in relation to her coronary artery/CABG. - Order complete blood count. - Check hepatitis panel - Consider referral for further evaluation based on blood work results.  History of coronary artery bypass graft x 1 Seven months post-CABG with abnormal chest sensation and scar tissue discomfort.  CABG performed secondary to spontaneous dissection of LAD coronary artery. Current cholesterol levels appropriate. - Continue daily baby aspirin , Plavix , iron  vitamin C  tablet, and metoprolol  25 mg. - Follow up with cardiology as scheduled.  Defer to specialist management.  Chronic fatigue Persistent fatigue 7 months post-CABG. Differential includes prolonged recovery, vitamin D  or B12 deficiency, anemia, or thyroid  dysfunction. - Order vitamin D  and B12  levels. - Order complete blood count. - Recheck thyroid  function.  Pap smear due Last Pap smear 02/19/2021 showed ASC-US .  Previous 5 Pap smears showed NILM and 2 of those were negative for HPV.  One of the five, collected on 11/26/2017, was positive for high risk HPV.   Agreed to Pap smear and HPV testing today. Informed about potential need for colposcopy if results are positive. - Perform Pap smear and HPV testing. - Follow up on results and refer for colposcopy if indicated by results.     Return in about 1 year (around 06/06/2025) for CPE.     I discussed the assessment and treatment plan with the patient  The patient was provided an opportunity to ask questions and all were answered. The patient agreed with the plan and demonstrated an understanding of the instructions.   The patient was advised to call back or seek an in-person evaluation if the symptoms worsen or if the condition fails to improve as anticipated.    Carlean Charter, DO  [facility] [phone] (phone) [phone] (fax)  [facility]

## 2024-06-08 LAB — CYTOLOGY - PAP
Adequacy: ABNORMAL
Comment: NEGATIVE
High risk HPV: NEGATIVE

## 2024-06-09 ENCOUNTER — Ambulatory Visit: Payer: Self-pay | Admitting: Family Medicine

## 2024-06-09 DIAGNOSIS — R161 Splenomegaly, not elsewhere classified: Secondary | ICD-10-CM

## 2024-06-09 LAB — CBC WITH DIFFERENTIAL/PLATELET
Basophils Absolute: 0 10*3/uL (ref 0.0–0.2)
Basos: 1 %
EOS (ABSOLUTE): 0.2 10*3/uL (ref 0.0–0.4)
Eos: 2 %
Hematocrit: 43.8 % (ref 34.0–46.6)
Hemoglobin: 14.8 g/dL (ref 11.1–15.9)
Immature Grans (Abs): 0 10*3/uL (ref 0.0–0.1)
Immature Granulocytes: 0 %
Lymphocytes Absolute: 1.6 10*3/uL (ref 0.7–3.1)
Lymphs: 24 %
MCH: 32 pg (ref 26.6–33.0)
MCHC: 33.8 g/dL (ref 31.5–35.7)
MCV: 95 fL (ref 79–97)
Monocytes Absolute: 0.5 10*3/uL (ref 0.1–0.9)
Monocytes: 7 %
Neutrophils Absolute: 4.5 10*3/uL (ref 1.4–7.0)
Neutrophils: 66 %
Platelets: 449 10*3/uL (ref 150–450)
RBC: 4.62 x10E6/uL (ref 3.77–5.28)
RDW: 13.6 % (ref 11.7–15.4)
WBC: 6.8 10*3/uL (ref 3.4–10.8)

## 2024-06-09 LAB — HEPATITIS A ANTIBODY, TOTAL: hep A Total Ab: POSITIVE — AB

## 2024-06-09 LAB — COMPREHENSIVE METABOLIC PANEL WITH GFR
ALT: 45 IU/L — ABNORMAL HIGH (ref 0–32)
AST: 33 IU/L (ref 0–40)
Albumin: 5.1 g/dL — ABNORMAL HIGH (ref 3.9–4.9)
Alkaline Phosphatase: 81 IU/L (ref 44–121)
BUN/Creatinine Ratio: 19 (ref 9–23)
BUN: 17 mg/dL (ref 6–24)
Bilirubin Total: 0.2 mg/dL (ref 0.0–1.2)
CO2: 22 mmol/L (ref 20–29)
Calcium: 9.8 mg/dL (ref 8.7–10.2)
Chloride: 102 mmol/L (ref 96–106)
Creatinine, Ser: 0.88 mg/dL (ref 0.57–1.00)
Globulin, Total: 1.9 g/dL (ref 1.5–4.5)
Glucose: 88 mg/dL (ref 70–99)
Potassium: 4.6 mmol/L (ref 3.5–5.2)
Sodium: 139 mmol/L (ref 134–144)
Total Protein: 7 g/dL (ref 6.0–8.5)
eGFR: 82 mL/min/{1.73_m2} (ref >59)

## 2024-06-09 LAB — HCV INTERPRETATION

## 2024-06-09 LAB — VITAMIN B12: Vitamin B-12: 837 pg/mL (ref 232–1245)

## 2024-06-09 LAB — HEPATITIS A ANTIBODY, IGM: Hep A IgM: NEGATIVE

## 2024-06-09 LAB — HIV ANTIBODY (ROUTINE TESTING W REFLEX)

## 2024-06-09 LAB — VITAMIN D 25 HYDROXY (VIT D DEFICIENCY, FRACTURES): Vit D, 25-Hydroxy: 47.3 ng/mL (ref 30.0–100.0)

## 2024-06-09 LAB — TSH RFX ON ABNORMAL TO FREE T4: TSH: 2.66 u[IU]/mL (ref 0.450–4.500)

## 2024-06-09 LAB — HCV AB W REFLEX TO QUANT PCR: HCV Ab: NONREACTIVE

## 2024-06-09 LAB — HEPATITIS B SURFACE ANTIGEN: Hepatitis B Surface Ag: NEGATIVE

## 2024-06-09 LAB — HEPATITIS B SURFACE ANTIBODY,QUALITATIVE: Hep B Surface Ab, Qual: REACTIVE

## 2024-06-14 ENCOUNTER — Encounter: Payer: Self-pay | Admitting: Family Medicine

## 2024-06-15 ENCOUNTER — Ambulatory Visit: Attending: Internal Medicine | Admitting: Internal Medicine

## 2024-06-15 ENCOUNTER — Encounter: Payer: Self-pay | Admitting: Internal Medicine

## 2024-06-15 VITALS — BP 115/61 | HR 74 | Ht 63.5 in | Wt 144.6 lb

## 2024-06-15 DIAGNOSIS — I2542 Coronary artery dissection: Secondary | ICD-10-CM | POA: Diagnosis not present

## 2024-06-15 DIAGNOSIS — R161 Splenomegaly, not elsewhere classified: Secondary | ICD-10-CM | POA: Diagnosis not present

## 2024-06-15 DIAGNOSIS — I252 Old myocardial infarction: Secondary | ICD-10-CM

## 2024-06-15 NOTE — Patient Instructions (Signed)
 Medication Instructions:  Stop taking metoprolol   Talk to your primary care provider about the need to continue taking iron   *If you need a refill on your cardiac medications before your next appointment, please call your pharmacy*  Lab Work: None  Testing/Procedures: None  Follow-Up: At [facility], you and your health needs are our priority.  As part of our continuing mission to provide you with exceptional heart care, our providers are all part of one team.  This team includes your primary Cardiologist (physician) and Advanced Practice Providers or APPs (Physician Assistants and Nurse Practitioners) who all work together to provide you with the care you need, when you need it.  Your next appointment:   6 month(s)  Provider:   You may see Sammy Crisp, MD or one of the following Advanced Practice Providers on your designated Care Team:   Laneta Pintos, NP Gildardo Labrador, PA-C Varney Gentleman, PA-C Cadence Hermosa, PA-C Ronald Cockayne, NP Morey Ar, NP    We recommend signing up for the patient portal called MyChart.  Sign up information is provided on this After Visit Summary.  MyChart is used to connect with patients for Virtual Visits (Telemedicine).  Patients are able to view lab/test results, encounter notes, upcoming appointments, etc.  Non-urgent messages can be sent to your provider as well.   To learn more about what you can do with MyChart, go to [email or web address].   Other Instructions N/A

## 2024-06-15 NOTE — Progress Notes (Signed)
 Cardiology Office Note:  .   Date:  06/15/2024  ID:  Traci May, DOB 1976/01/24, MRN [id number] PCP: Carlean Charter, DO  [facility] Providers Cardiologist:  Sammy Crisp, MD     History of Present Illness: Traci May   Traci May is a 48 y.o. female with NSTEMI due to spontaneous coronary artery dissection at the ostium of the LAD on 12/04/2023 status post urgent single-vessel CABG on 12/05/2023, who presents for follow-up of coronary artery disease.  I last saw her in March, at which time she was feeling fairly well.  She noted some mild soreness and sensitivity around her median sternotomy incision but was otherwise without pain.  We agreed to obtain CTA of the neck, chest, abdomen, and pelvis to screen for fibromuscular dysplasia in the setting of her SCAD.  Today, Traci May reports that she has been feeling fairly well without any angina, shortness of breath, palpitations, lightheadedness, or edema.  She still has a weird sensation surrounding her median sternotomy incision.  She exercises 3 times a week but reports that she lacks energy and feels both mentally and physically drained.  This has been present ever since her NSTEMI/CABG.  Her PCP has ordered a labs for further workup of splenomegaly and has also referred Traci May to GI for further evaluation.  ROS: See HPI  Studies Reviewed: Traci May   EKG Interpretation Date/Time:  Wednesday June 15 2024 15:49:40 EDT Ventricular Rate:  78 PR Interval:  150 QRS Duration:  82 QT Interval:  366 QTC Calculation: 417 R Axis:   75  Text Interpretation: Normal sinus rhythm Normal ECG When compared with ECG of 03-Mar-2024 09:50, No significant change was found Confirmed by Colbin Jovel [phone]) on 06/15/2024 4:58:31 PM    CTA neck, chest, abdomen, and pelvis (03/23/2024): No evidence of fibromuscular dysplasia or other significant vascular abnormality.  Moderate splenomegaly noted.  Risk Assessment/Calculations:             Physical  Exam:   VS:  BP 115/61 (BP Location: Left Arm, Patient Position: Sitting, Cuff Size: Normal)   Pulse 74   Ht 5' 3.5 (1.613 m)   Wt 144 lb 9.6 oz (65.6 kg)   LMP 02/09/2019 Comment: postmenopause  SpO2 98%   BMI 25.21 kg/m    Wt Readings from Last 3 Encounters:  06/15/24 144 lb 9.6 oz (65.6 kg)  06/06/24 144 lb 9.6 oz (65.6 kg)  03/03/24 139 lb 2 oz (63.1 kg)    General:  NAD. Neck: No JVD or HJR. Lungs: Clear to auscultation bilaterally without wheezes or crackles. Heart: Regular rate and rhythm without murmurs, rubs, or gallops. Abdomen: Soft, nontender, nondistended. Extremities: No lower extremity edema.  ASSESSMENT AND PLAN: .    Coronary artery disease with spontaneous coronary artery dissection: No angina reported with Traci May having recovered well from her emergent single-vessel CABG in the setting of spontaneous coronary artery dissection involving the proximal LAD in 11/2023.  We will continue 12 months of aspirin  and clopidogrel .  Given aforementioned fatigue, we have agreed to discontinue metoprolol  to see if that helps improve her symptoms.  CTA neck, chest, abdomen, and pelvis was reassuring without evidence of fibromuscular dysplasia.  Splenomegaly: Incidentally noted on CTA.  Workup ongoing under the direction of Dr. Athena Bland.  GI consultation also pending.  If workup is unrevealing and fatigue persists despite discontinuation of metoprolol , referral to heme/onc will also need to be considered.    Dispo: Return to clinic in  6 months.  Signed, Sammy Crisp, MD

## 2024-10-10 ENCOUNTER — Other Ambulatory Visit: Payer: Self-pay

## 2024-10-13 ENCOUNTER — Other Ambulatory Visit: Payer: Self-pay

## 2024-10-17 ENCOUNTER — Encounter: Payer: Self-pay | Admitting: Pharmacist

## 2024-10-17 ENCOUNTER — Other Ambulatory Visit: Payer: Self-pay

## 2024-10-17 ENCOUNTER — Other Ambulatory Visit (HOSPITAL_COMMUNITY): Payer: Self-pay

## 2024-10-18 ENCOUNTER — Other Ambulatory Visit: Payer: Self-pay

## 2024-10-18 ENCOUNTER — Other Ambulatory Visit: Payer: Self-pay | Admitting: Gastroenterology

## 2024-10-18 DIAGNOSIS — Z1211 Encounter for screening for malignant neoplasm of colon: Secondary | ICD-10-CM | POA: Diagnosis not present

## 2024-10-18 DIAGNOSIS — R161 Splenomegaly, not elsewhere classified: Secondary | ICD-10-CM

## 2024-10-18 DIAGNOSIS — Z7901 Long term (current) use of anticoagulants: Secondary | ICD-10-CM | POA: Diagnosis not present

## 2024-10-18 MED ORDER — NA SULFATE-K SULFATE-MG SULF 17.5-3.13-1.6 GM/177ML PO SOLN
1.0000 | ORAL | 0 refills | Status: AC
  Filled 2024-10-18 – 2024-11-01 (×2): qty 354, 1d supply, fill #0

## 2024-10-24 ENCOUNTER — Ambulatory Visit
Admission: RE | Admit: 2024-10-24 | Discharge: 2024-10-24 | Disposition: A | Discharge status: Home / Self Care | Source: Ambulatory Visit | Attending: Gastroenterology | Admitting: Gastroenterology

## 2024-10-24 DIAGNOSIS — R161 Splenomegaly, not elsewhere classified: Secondary | ICD-10-CM | POA: Diagnosis not present

## 2024-10-31 ENCOUNTER — Other Ambulatory Visit: Payer: Self-pay

## 2024-11-01 ENCOUNTER — Other Ambulatory Visit: Payer: Self-pay

## 2024-11-02 ENCOUNTER — Inpatient Hospital Stay

## 2024-11-02 ENCOUNTER — Encounter: Payer: Self-pay | Admitting: Oncology

## 2024-11-02 ENCOUNTER — Inpatient Hospital Stay: Attending: Oncology | Admitting: Oncology

## 2024-11-02 VITALS — BP 129/79 | HR 67 | Temp 97.4°F | Resp 18 | Ht 63.5 in | Wt 143.3 lb

## 2024-11-02 DIAGNOSIS — I252 Old myocardial infarction: Secondary | ICD-10-CM | POA: Insufficient documentation

## 2024-11-02 DIAGNOSIS — R5382 Chronic fatigue, unspecified: Secondary | ICD-10-CM | POA: Diagnosis not present

## 2024-11-02 DIAGNOSIS — Z7982 Long term (current) use of aspirin: Secondary | ICD-10-CM | POA: Diagnosis not present

## 2024-11-02 DIAGNOSIS — R161 Splenomegaly, not elsewhere classified: Secondary | ICD-10-CM | POA: Diagnosis not present

## 2024-11-02 LAB — TECHNOLOGIST SMEAR REVIEW

## 2024-11-02 LAB — CBC WITH DIFFERENTIAL (CANCER CENTER ONLY)
Abs Immature Granulocytes: 0.01 K/uL (ref 0.00–0.07)
Basophils Absolute: 0.1 K/uL (ref 0.0–0.1)
Basophils Relative: 1 %
Eosinophils Absolute: 0.2 K/uL (ref 0.0–0.5)
Eosinophils Relative: 3 %
HCT: 45.3 % (ref 36.0–46.0)
Hemoglobin: 15.3 g/dL — ABNORMAL HIGH (ref 12.0–15.0)
Immature Granulocytes: 0 %
Lymphocytes Relative: 22 %
Lymphs Abs: 1.7 K/uL (ref 0.7–4.0)
MCH: 31.4 pg (ref 26.0–34.0)
MCHC: 33.8 g/dL (ref 30.0–36.0)
MCV: 92.8 fL (ref 80.0–100.0)
Monocytes Absolute: 0.5 K/uL (ref 0.1–1.0)
Monocytes Relative: 6 %
Neutro Abs: 5.3 K/uL (ref 1.7–7.7)
Neutrophils Relative %: 68 %
Platelet Count: 443 K/uL — ABNORMAL HIGH (ref 150–400)
RBC: 4.88 MIL/uL (ref 3.87–5.11)
RDW: 13 % (ref 11.5–15.5)
WBC Count: 7.7 K/uL (ref 4.0–10.5)
nRBC: 0 % (ref 0.0–0.2)

## 2024-11-02 LAB — CMP (CANCER CENTER ONLY)
ALT: 32 U/L (ref 0–44)
AST: 28 U/L (ref 15–41)
Albumin: 5 g/dL (ref 3.5–5.0)
Alkaline Phosphatase: 64 U/L (ref 38–126)
Anion gap: 9 (ref 5–15)
BUN: 23 mg/dL — ABNORMAL HIGH (ref 6–20)
CO2: 26 mmol/L (ref 22–32)
Calcium: 9.8 mg/dL (ref 8.9–10.3)
Chloride: 101 mmol/L (ref 98–111)
Creatinine: 0.9 mg/dL (ref 0.44–1.00)
GFR, Estimated: >60 mL/min (ref >60)
Glucose, Bld: 104 mg/dL — ABNORMAL HIGH (ref 70–99)
Potassium: 4.7 mmol/L (ref 3.5–5.1)
Sodium: 136 mmol/L (ref 135–145)
Total Bilirubin: 0.7 mg/dL (ref 0.0–1.2)
Total Protein: 7.5 g/dL (ref 6.5–8.1)

## 2024-11-02 NOTE — Progress Notes (Signed)
 New patient; Referral for Splenomegaly.

## 2024-11-02 NOTE — Progress Notes (Signed)
 Hematology/Oncology Consult note [facility] Telephone:([phone] [phone]  Patient Care Team: Donzella Lauraine SAILOR, DO as PCP - General (Family Medicine) End, Lonni, MD as PCP - Cardiology (Cardiology)   Name of the patient: Traci May  [id number]  [birth year]    Reason for referral-splenomegaly   Referring physician-Dr. Lauraine Donzella  Date of visit: 11/02/24   History of presenting illness-patient is a 48 year old female with a past medical history significant for CAD s/p CABG, history of endometriosisReferred for splenic megaly.  Patient underwent CT angio chest abdomen and pelvis to rule out possible dissection in March 2025.  She was incidentally found to have moderate splenomegaly with a spleen size of 13.6 x 6.7 x 14 cm with a volume of 1275 g.  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  We do not have any prior scans for comparison. Patient has no known history of chronic liver disease.  No liver abnormality was noted on her CT in March 2025.  Labs in June 2025 including CBC and CMP were unremarkable.Patient also has ultrasound abdomen on 10/24/2024 which showed enlarged spleen 15 x 5.8 x 14.8 cm with a volume of 670 cc.  No abnormal appearance of liver.  Discussed the use of AI scribe software for clinical note transcription with the patient, who gave verbal consent to proceed. She reports no symptoms such as night sweats, weight loss, or swollen lymph glands, and her CBC has been normal.  No growth issues during childhood or family history of disorders.      History of Present Illness  ECOG PS- 0  Pain scale- 0   Review of systems- Review of Systems  Constitutional:  Negative for chills, fever, malaise/fatigue and weight loss.  HENT:  Negative for congestion, ear discharge and nosebleeds.   Eyes:  Negative  for blurred vision.  Respiratory:  Negative for cough, hemoptysis, sputum production, shortness of breath and wheezing.   Cardiovascular:  Negative for chest pain, palpitations, orthopnea and claudication.  Gastrointestinal:  Negative for abdominal pain, blood in stool, constipation, diarrhea, heartburn, melena, nausea and vomiting.  Genitourinary:  Negative for dysuria, flank pain, frequency, hematuria and urgency.  Musculoskeletal:  Negative for back pain, joint pain and myalgias.  Skin:  Negative for rash.  Neurological:  Negative for dizziness, tingling, focal weakness, seizures, weakness and headaches.  Endo/Heme/Allergies:  Does not bruise/bleed easily.  Psychiatric/Behavioral:  Negative for depression and suicidal ideas. The patient does not have insomnia.     No Known Allergies  Patient Active Problem List   Diagnosis Date Noted   Abnormal cervical Papanicolaou smear 06/06/2024   Endometriosis 06/06/2024   Encounter for medical examination to establish care 06/06/2024   Splenomegaly 06/06/2024   Chronic fatigue 06/06/2024   Pap smear for cervical cancer screening 06/06/2024   S/P CABG x 1 12/05/2023   Chest pain 12/04/2023   NSTEMI (non-ST elevated myocardial infarction) (HCC) 12/04/2023   Spontaneous dissection of coronary artery 12/04/2023     Past Medical History:  Diagnosis Date   Hx of CABG 12/05/2023   LIMA-LAD   NSTEMI (non-ST elevated myocardial infarction) (HCC) 12/04/2023   NSTEMI in setting of SCAD   Spontaneous dissection of coronary artery      Past Surgical History:  Procedure Laterality Date   BUNIONECTOMY Left 04/25/2020   Procedure: LAPIDUS TYPE LEFT;  Surgeon: Ashley Soulier, DPM;  Location:  [facility];  Service: Podiatry;  Laterality: Left;  general with local   BUNIONECTOMY Right 02/18/2023   Procedure: BUNIONECTOMY - LAPIDUS;  Surgeon: Ashley Soulier, DPM;  Location: [facility];  Service: Podiatry;  Laterality: Right;    CORONARY ARTERY BYPASS GRAFT N/A 12/05/2023   Procedure: CORONARY ARTERY BYPASS GRAFTING (CABG) TIMES ONE, USING LEFT INTERNAL MAMMARY ARTERY;  Surgeon: Maryjane Mt, MD;  Location: [facility] OR;  Service: Open Heart Surgery;  Laterality: N/A;   HALLUX VALGUS AKIN Left 04/25/2020   Procedure: HALLUX VALGUS AKIN;  Surgeon: Ashley Soulier, DPM;  Location: [facility];  Service: Podiatry;  Laterality: Left;   LEFT HEART CATH AND CORONARY ANGIOGRAPHY N/A 12/04/2023   Procedure: LEFT HEART CATH AND CORONARY ANGIOGRAPHY;  Surgeon: Ammon Blunt, MD;  Location: [facility];  Service: Cardiovascular;  Laterality: N/A;   TEE WITHOUT CARDIOVERSION N/A 12/05/2023   Procedure: TRANSESOPHAGEAL ECHOCARDIOGRAM (TEE);  Surgeon: Maryjane Mt, MD;  Location: [facility];  Service: Open Heart Surgery;  Laterality: N/A;    Social History   Socioeconomic History   Marital status: Single    Spouse name: Not on file   Number of children: Not on file   Years of education: Not on file   Highest education level: Associate degree: occupational, scientist, product/process development, or vocational program  Occupational History   Not on file  Tobacco Use   Smoking status: Never   Smokeless tobacco: Never  Vaping Use   Vaping status: Never Used  Substance and Sexual Activity   Alcohol use: Yes    Comment: occasional   Drug use: Never   Sexual activity: Not on file  Other Topics Concern   Not on file  Social History Narrative   Not on file   Social Drivers of Health   Financial Resource Strain: Low Risk  (10/18/2024)   Received from [facility]   Overall Financial Resource Strain (CARDIA)    Difficulty of Paying Living Expenses: Not hard at all  Food Insecurity: No Food Insecurity (10/30/2024)   Hunger Vital Sign    Worried About Running Out of Food in the Last Year: Never true    Ran Out of Food in the Last Year: Never true  Transportation Needs: No Transportation Needs (10/30/2024)   PRAPARE -  Administrator, Civil Service (Medical): No    Lack of Transportation (Non-Medical): No  Physical Activity: Sufficiently Active (06/02/2024)   Exercise Vital Sign    Days of Exercise per Week: 3 days    Minutes of Exercise per Session: 50 min  Stress: Stress Concern Present (06/02/2024)   [employer] of Occupational Health - Occupational Stress Questionnaire    Feeling of Stress : To some extent  Social Connections: Unknown (06/02/2024)   Social Connection and Isolation Panel    Frequency of Communication with Friends and Family: Patient declined    Frequency of Social Gatherings with Friends and Family: Patient declined    Attends Religious Services: Patient declined    Database Administrator or Organizations: Patient declined    Attends Banker Meetings: Not on file    Marital Status: Patient declined  Intimate Partner Violence: Not At Risk (12/04/2023)   Humiliation, Afraid, Rape, and Kick questionnaire    Fear of Current or Ex-Partner: No    Emotionally Abused: No    Physically Abused: No    Sexually Abused: No     Family History  Problem Relation Age of Onset  Diabetes Mother    Hypertension Mother    Hyperlipidemia Mother    Dementia Maternal Grandmother    Alzheimer's disease Maternal Grandmother    Diabetes Maternal Grandfather    Hypertension Maternal Grandfather    Breast cancer Neg Hx      Current Outpatient Medications:    aspirin  EC 81 MG tablet, Take 1 tablet (81 mg total) by mouth daily., Disp: , Rfl:    clopidogrel  (PLAVIX ) 75 MG tablet, Take 1 tablet (75 mg total) by mouth daily., Disp: 90 tablet, Rfl: 3   Multiple Vitamin (MULTIVITAMIN) capsule, Take 1 capsule by mouth daily., Disp: , Rfl:    Iron -Vitamin C  65-125 MG TABS, Take 1 tablet by mouth daily., Disp: , Rfl:    Na Sulfate-K Sulfate-Mg Sulfate concentrate (SUPREP) 17.5-3.13-1.6 GM/177ML SOLN, Take 1 Bottle by mouth as directed One kit contains 2 bottles.  Take both bottles  at the times instructed by your provider., Disp: 354 mL, Rfl: 0   Physical exam: There were no vitals filed for this visit. Physical Exam Cardiovascular:     Rate and Rhythm: Normal rate and regular rhythm.     Heart sounds: Normal heart sounds.  Pulmonary:     Effort: Pulmonary effort is normal.     Breath sounds: Normal breath sounds.  Abdominal:     General: Bowel sounds are normal.     Palpations: Abdomen is soft.     Comments: Splenic tip palpable  Lymphadenopathy:     Comments: No palpable cervical, supraclavicular, axillary or inguinal adenopathy    Skin:    General: Skin is warm and dry.  Neurological:     Mental Status: She is alert and oriented to person, place, and time.           Latest Ref Rng & Units 06/06/2024    3:23 PM  CMP  Glucose 70 - 99 mg/dL 88   BUN 6 - 24 mg/dL 17   Creatinine 9.42 - 1.00 mg/dL 9.11   Sodium 865 - 855 mmol/L 139   Potassium 3.5 - 5.2 mmol/L 4.6   Chloride 96 - 106 mmol/L 102   CO2 20 - 29 mmol/L 22   Calcium 8.7 - 10.2 mg/dL 9.8   Total Protein 6.0 - 8.5 g/dL 7.0   Total Bilirubin 0.0 - 1.2 mg/dL 0.2   Alkaline Phos 44 - 121 IU/L 81   AST 0 - 40 IU/L 33   ALT 0 - 32 IU/L 45       Latest Ref Rng & Units 06/06/2024    3:23 PM  CBC  WBC 3.4 - 10.8 x10E3/uL 6.8   Hemoglobin 11.1 - 15.9 g/dL 85.1   Hematocrit 65.9 - 46.6 % 43.8   Platelets 150 - 450 x10E3/uL 449     No images are attached to the encounter.  US  Abdomen Complete Result Date: 10/24/2024 CLINICAL DATA:  Splenomegaly EXAM: ABDOMEN ULTRASOUND COMPLETE COMPARISON:  CT angiography chest abdomen pelvis 03/23/2024 FINDINGS: Gallbladder: No gallstones or wall thickening visualized. No sonographic Murphy sign noted by sonographer. Common bile duct: Diameter: 1 mm Liver: No focal lesion identified. Within normal limits in parenchymal echogenicity. Portal vein is patent on color Doppler imaging with normal direction of blood flow towards the liver. IVC: No abnormality  visualized. Pancreas: Visualized portion unremarkable. Spleen: Spleen is enlarged measuring 15.0 x 5.8 x 14.8 cm in maximum dimension and volume of 670 CC. Right Kidney: Length: 10.1 cm. Echogenicity within normal limits. No mass or hydronephrosis visualized.  Left Kidney: Length: 11.2 cm. Echogenicity within normal limits. No mass or hydronephrosis visualized. Abdominal aorta: No aneurysm visualized. Other findings: None. IMPRESSION: Persistent splenomegaly with the spleen measuring 15.0 x 5.8 x 14.8 cm compared to 14.0 x 6.7 x 13.6 cm on prior CT. Electronically Signed   By: Aliene Lloyd M.D.   On: 10/24/2024 15:36    Assessment and plan- Patient is a 48 y.o. female Patient is a 48 year old female referred for splenomegaly.  Patient has no known history of chronic liver disease.  CBC not suggestive of any primary hematologic disorder.  Her last CT which was in March 2025 incidentally showed a spleen size of 14 cm and reecnt us  spleen confirms same findings We do not have any prior scans for comparison and therefore it is unclear if splenomegaly is chronic or more acute.  Patient does not report any B symptoms  CBC with differential, CMP, smear review today.  In person or video visit with me in 2 weeks time.  I am inclined to repeat us  abdomen in 3 months time. If there is persistent splenomegaly I will get PET CT scan and consider bone marrow biopsyat that time. Between CT in march 2025 and recent USG- her splenic volume has decreased.    Thank you for this kind referral and the opportunity to participate in the care of this patient   Visit Diagnosis 1. Splenomegaly     Dr. Annah Skene, MD, MPH [facility] [phone] 11/02/2024

## 2024-11-14 ENCOUNTER — Telehealth: Payer: Self-pay | Admitting: Internal Medicine

## 2024-11-14 NOTE — Telephone Encounter (Signed)
   Pre-operative Risk Assessment    Patient Name: Traci May  DOB: Apr 10, 1976 MRN: [id number]  Date of last office visit: 06/15/24 Date of next office visit: PATIENT IS DUE 11/2024   Request for Surgical Clearance    Procedure:  COLONOSCOPY  Date of Surgery:  Clearance 01/13/25                              Surgeon:  DR OLE PACK Surgeon's Group or Practice Name:  [facility] Phone number:  [phone] Fax number:  [phone]  Type of Clearance Requested:   - Pharmacy:  Hold Clopidogrel  (Plavix ) 5 DAYS PRIOR  Type of Anesthesia:  Not Indicated   Additional requests/questions:    Signed, Samule LITTIE Bristol   11/14/2024, 4:09 PM

## 2024-11-14 NOTE — Telephone Encounter (Signed)
 Dr. Mady,  Traci May is requesting preoperative cardiac evaluation for colonoscopy.  She was seen by you in clinic on 06/15/2024.  During that time she remained stable from a cardiac standpoint.  She was following up for spontaneous coronary artery dissection.  She was continued on dual antiplatelet therapy.  Follow-up was planned for 6 months.  May her Plavix  be held prior to her procedure?  Please direct her response to CVD IV preop pool.  Thank you for your help  Traci May. Deina Lipsey NP-C     11/14/2024, 4:28 PM [facility] [address] [place], [place] [address] Office [phone] ,

## 2024-11-15 ENCOUNTER — Inpatient Hospital Stay: Admitting: Oncology

## 2024-11-15 NOTE — Telephone Encounter (Signed)
   Name: Traci May  DOB: 03/22/1976  MRN: [id number]  Primary Cardiologist: Lonni Hanson, MD  Chart reviewed as part of pre-operative protocol coverage. Because of Traci May's past medical history and time since last visit, she will require a follow-up in-office visit in order to better assess preoperative cardiovascular risk.  Pre-op covering staff: - Please schedule appointment and call patient to inform them. If patient already had an upcoming appointment within acceptable timeframe, please add pre-op clearance to the appointment notes so provider is aware. - Please contact requesting surgeon's office via preferred method (i.e, phone, fax) to inform them of need for appointment prior to surgery.    Jon Garre Dereon Corkery, PA  11/15/2024, 10:21 AM

## 2024-11-15 NOTE — Telephone Encounter (Signed)
 Given that 7-month follow-up was recommended at her last visit in June, I recommend that she follow-up as suggested, which would be in December.  Hopefully, clopidogrel  can be discontinued at that time following 12 months of DAPT.  That should not interfere with her planned colonoscopy in January.  If you can help arrange for this follow-up appointment I would greatly appreciate it.  Lonni Hanson, MD [facility]

## 2024-11-15 NOTE — Progress Notes (Signed)
 Patient has no new or acute concerns at this time.

## 2024-11-15 NOTE — Telephone Encounter (Signed)
 Patient has been scheduled for OFFICE VISIT

## 2024-11-30 ENCOUNTER — Inpatient Hospital Stay: Admitting: Oncology

## 2024-12-04 NOTE — Progress Notes (Signed)
 I connected with Traci May on 12/04/24 at  3:15 PM EST by video enabled telemedicine visit and verified that I am speaking with the correct person using two identifiers.   I discussed the limitations, risks, security and privacy concerns of performing an evaluation and management service by telemedicine and the availability of in-person appointments. I also discussed with the patient that there may be a patient responsible charge related to this service. The patient expressed understanding and agreed to proceed.  Other persons participating in the visit and their role in the encounter:  none  Patient's location:  home Provider's location:  work  Stage Manager Complaint:  discuss results of bloodwork  History of present illness: patient is a 48 year old female with a past medical history significant for CAD s/p CABG, history of endometriosisReferred for splenic megaly.  Patient underwent CT angio chest abdomen and pelvis to rule out possible dissection in March 2025.  She was incidentally found to have moderate splenomegaly with a spleen size of 13.6 x 6.7 x 14 cm with a volume of 1275 g.  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  We do not have any prior scans for comparison. Patient has no known history of chronic liver disease.  No liver abnormality was noted on her CT in March 2025.  Labs in June 2025 including CBC and CMP were unremarkable.Patient also has ultrasound abdomen on 10/24/2024 which showed enlarged spleen 15 x 5.8 x 14.8 cm with a volume of 670 cc.  No abnormal appearance of liver.   Discussed the use of AI scribe software for clinical note transcription with the patient, who gave verbal consent to proceed. She reports no symptoms such as night sweats, weight loss, or swollen lymph glands, and her CBC has been normal.   No growth issues during childhood or family  history of disorders.   Cbc, cmp and smear review on 11/02/24 normal.      Interval history she is doing well presently and denies any new complaints   Review of Systems  Constitutional:  Negative for chills, fever, malaise/fatigue and weight loss.  HENT:  Negative for congestion, ear discharge and nosebleeds.   Eyes:  Negative for blurred vision.  Respiratory:  Negative for cough, hemoptysis, sputum production, shortness of breath and wheezing.   Cardiovascular:  Negative for chest pain, palpitations, orthopnea and claudication.  Gastrointestinal:  Negative for abdominal pain, blood in stool, constipation, diarrhea, heartburn, melena, nausea and vomiting.  Genitourinary:  Negative for dysuria, flank pain, frequency, hematuria and urgency.  Musculoskeletal:  Negative for back pain, joint pain and myalgias.  Skin:  Negative for rash.  Neurological:  Negative for dizziness, tingling, focal weakness, seizures, weakness and headaches.  Endo/Heme/Allergies:  Does not bruise/bleed easily.  Psychiatric/Behavioral:  Negative for depression and suicidal ideas. The patient does not have insomnia.     No Known Allergies  Past Medical History:  Diagnosis Date   Hx of CABG 12/05/2023   LIMA-LAD   NSTEMI (non-ST elevated myocardial infarction) (HCC) 12/04/2023   NSTEMI in setting of SCAD   Spontaneous dissection of coronary artery     Past Surgical History:  Procedure Laterality Date   BUNIONECTOMY Left 04/25/2020   Procedure: LAPIDUS TYPE LEFT;  Surgeon: Ashley Soulier, DPM;  Location: [facility];  Service: Podiatry;  Laterality: Left;  general with local   BUNIONECTOMY Right 02/18/2023   Procedure: BUNIONECTOMY -  LAPIDUS;  Surgeon: Ashley Soulier, DPM;  Location: [facility];  Service: Podiatry;  Laterality: Right;   CORONARY ARTERY BYPASS GRAFT N/A 12/05/2023   Procedure: CORONARY ARTERY BYPASS GRAFTING (CABG) TIMES ONE, USING LEFT INTERNAL MAMMARY ARTERY;  Surgeon:  Maryjane Mt, MD;  Location: [facility] OR;  Service: Open Heart Surgery;  Laterality: N/A;   HALLUX VALGUS AKIN Left 04/25/2020   Procedure: HALLUX VALGUS AKIN;  Surgeon: Ashley Soulier, DPM;  Location: [facility];  Service: Podiatry;  Laterality: Left;   LEFT HEART CATH AND CORONARY ANGIOGRAPHY N/A 12/04/2023   Procedure: LEFT HEART CATH AND CORONARY ANGIOGRAPHY;  Surgeon: Ammon Blunt, MD;  Location: [facility];  Service: Cardiovascular;  Laterality: N/A;   TEE WITHOUT CARDIOVERSION N/A 12/05/2023   Procedure: TRANSESOPHAGEAL ECHOCARDIOGRAM (TEE);  Surgeon: Maryjane Mt, MD;  Location: [facility] OR;  Service: Open Heart Surgery;  Laterality: N/A;    Social History   Socioeconomic History   Marital status: Significant Other    Spouse name: Not on file   Number of children: 0   Years of education: Not on file   Highest education level: Associate degree: occupational, scientist, product/process development, or vocational program  Occupational History   Not on file  Tobacco Use   Smoking status: Never   Smokeless tobacco: Never  Vaping Use   Vaping status: Never Used  Substance and Sexual Activity   Alcohol use: Not Currently    Comment: occasional   Drug use: Never   Sexual activity: Not Currently  Other Topics Concern   Not on file  Social History Narrative   Not on file   Social Drivers of Health   Financial Resource Strain: Low Risk  (10/18/2024)   Received from [facility]   Overall Financial Resource Strain (CARDIA)    Difficulty of Paying Living Expenses: Not hard at all  Food Insecurity: No Food Insecurity (11/02/2024)   Hunger Vital Sign    Worried About Running Out of Food in the Last Year: Never true    Ran Out of Food in the Last Year: Never true  Transportation Needs: No Transportation Needs (11/02/2024)   PRAPARE - Administrator, Civil Service (Medical): No    Lack of Transportation (Non-Medical): No  Physical Activity: Sufficiently Active  (06/02/2024)   Exercise Vital Sign    Days of Exercise per Week: 3 days    Minutes of Exercise per Session: 50 min  Stress: Stress Concern Present (06/02/2024)   [employer] of Occupational Health - Occupational Stress Questionnaire    Feeling of Stress : To some extent  Social Connections: Unknown (06/02/2024)   Social Connection and Isolation Panel    Frequency of Communication with Friends and Family: Patient declined    Frequency of Social Gatherings with Friends and Family: Patient declined    Attends Religious Services: Patient declined    Database Administrator or Organizations: Patient declined    Attends Banker Meetings: Not on file    Marital Status: Patient declined  Intimate Partner Violence: Not At Risk (11/02/2024)   Humiliation, Afraid, Rape, and Kick questionnaire    Fear of Current or Ex-Partner: No    Emotionally Abused: No    Physically Abused: No    Sexually Abused: No    Family History  Problem Relation Age of Onset   Diabetes Mother    Hypertension Mother    Hyperlipidemia Mother    Dementia Maternal Grandmother    Alzheimer's disease Maternal  Grandmother    Diabetes Maternal Grandfather    Hypertension Maternal Grandfather    Breast cancer Neg Hx      Current Outpatient Medications:    aspirin  EC 81 MG tablet, Take 1 tablet (81 mg total) by mouth daily., Disp: , Rfl:    clopidogrel  (PLAVIX ) 75 MG tablet, Take 1 tablet (75 mg total) by mouth daily., Disp: 90 tablet, Rfl: 3   Multiple Vitamin (MULTIVITAMIN) capsule, Take 1 capsule by mouth daily., Disp: , Rfl:    Iron -Vitamin C  65-125 MG TABS, Take 1 tablet by mouth daily., Disp: , Rfl:    Na Sulfate-K Sulfate-Mg Sulfate concentrate (SUPREP) 17.5-3.13-1.6 GM/177ML SOLN, Take 1 Bottle by mouth as directed One kit contains 2 bottles.  Take both bottles at the times instructed by your provider., Disp: 354 mL, Rfl: 0  No results found.  No images are attached to the encounter.      Latest  Ref Rng & Units 11/02/2024    3:18 PM  CMP  Glucose 70 - 99 mg/dL 895   BUN 6 - 20 mg/dL 23   Creatinine 9.55 - 1.00 mg/dL 9.09   Sodium 864 - 854 mmol/L 136   Potassium 3.5 - 5.1 mmol/L 4.7   Chloride 98 - 111 mmol/L 101   CO2 22 - 32 mmol/L 26   Calcium 8.9 - 10.3 mg/dL 9.8   Total Protein 6.5 - 8.1 g/dL 7.5   Total Bilirubin 0.0 - 1.2 mg/dL 0.7   Alkaline Phos 38 - 126 U/L 64   AST 15 - 41 U/L 28   ALT 0 - 44 U/L 32       Latest Ref Rng & Units 11/02/2024    3:19 PM  CBC  WBC 4.0 - 10.5 K/uL 7.7   Hemoglobin 12.0 - 15.0 g/dL 84.6   Hematocrit 63.9 - 46.0 % 45.3   Platelets 150 - 400 K/uL 443      Observation/objective:appears in no acute distress over video visit today. Breathing is non labored  Assessment and plan:Patient is a 48 year old female referred for splenomegaly  Cause of splenomegaly presently is unclear.Ultrasound abdomen on 10/24/2024 showed splenic volume of 670 cc which was better as compared to splenic volume of 1275 g noted in March 2025.  At this time I am planning to monitor splenomegaly conservatively with a repeat ultrasound February 2026.  If splenomegaly persists or gets worse I will plan to get a PET scan at that time with consideration for bone marrow biopsy  Follow-up instructions:as above  I discussed the assessment and treatment plan with the patient. The patient was provided an opportunity to ask questions and all were answered. The patient agreed with the plan and demonstrated an understanding of the instructions.   The patient was advised to call back or seek an in-person evaluation if the symptoms worsen or if the condition fails to improve as anticipated.  I provided 12 minutes of face-to-face video visit time during this encounter, and > 50% was spent counseling as documented under my assessment & plan.  Visit Diagnosis: 1. Splenomegaly     Dr. Annah Skene, MD, MPH [facility] Tel-  [phone] 12/04/2024 6:49 PM

## 2024-12-05 ENCOUNTER — Other Ambulatory Visit: Payer: Self-pay | Admitting: Physician Assistant

## 2024-12-07 ENCOUNTER — Other Ambulatory Visit: Payer: Self-pay | Admitting: Physician Assistant

## 2024-12-12 ENCOUNTER — Other Ambulatory Visit: Payer: Self-pay

## 2024-12-12 ENCOUNTER — Encounter: Payer: Self-pay | Admitting: Internal Medicine

## 2024-12-12 MED ORDER — CLOPIDOGREL BISULFATE 75 MG PO TABS
75.0000 mg | ORAL_TABLET | Freq: Every day | ORAL | 0 refills | Status: AC
  Filled 2024-12-12 – 2024-12-15 (×3): qty 60, 60d supply, fill #0

## 2024-12-15 ENCOUNTER — Telehealth: Payer: Self-pay | Admitting: Internal Medicine

## 2024-12-15 ENCOUNTER — Other Ambulatory Visit: Payer: Self-pay

## 2024-12-15 NOTE — Telephone Encounter (Signed)
 Pre-op made aware of patient canceling clearance and procedure. Will remove from our pool and inform surgeon's office.

## 2024-12-15 NOTE — Telephone Encounter (Signed)
 Patient canceled Pre-op appointment stating she will be canceling surgery.

## 2024-12-19 ENCOUNTER — Ambulatory Visit: Admitting: Cardiology

## 2025-02-28 ENCOUNTER — Inpatient Hospital Stay: Admitting: Oncology

## 2025-03-15 ENCOUNTER — Ambulatory Visit: Payer: Self-pay | Admitting: Internal Medicine
